# Patient Record
Sex: Female | Born: 1990 | Race: White | Hispanic: No | Marital: Married | Smoking: Current every day smoker
Health system: Southern US, Community
[De-identification: ages and names within clinical notes are randomized; demographics above are authoritative.]

## PROBLEM LIST (undated history)

## (undated) ENCOUNTER — Inpatient Hospital Stay (HOSPITAL_COMMUNITY): Payer: Self-pay

## (undated) DIAGNOSIS — S52509A Unspecified fracture of the lower end of unspecified radius, initial encounter for closed fracture: Secondary | ICD-10-CM

## (undated) DIAGNOSIS — J302 Other seasonal allergic rhinitis: Secondary | ICD-10-CM

## (undated) HISTORY — DX: Other seasonal allergic rhinitis: J30.2

---
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
## 2010-07-12 NOTE — L&D Delivery Note (Signed)
Delivery Note At 12:13 AM a viable female was delivered via Vaginal, Spontaneous Delivery (Presentation: Left Occiput Anterior).  APGAR: 9, 9; weight 7 lb (3175 g).   Placenta status: , .  Cord: 3 vessels with the following complications: .  Cord pH: NA  Anesthesia: Epidural  Episiotomy: None Lacerations: Labial;2nd degree Suture Repair: 3.0 vicryl Est. Blood Loss (mL):   Mom to postpartum.  Baby to nursery-stable.  Alyssa Whitehead J. 05/12/2011, 12:36 AM

## 2010-11-03 LAB — HIV ANTIBODY (ROUTINE TESTING W REFLEX): HIV: NONREACTIVE

## 2010-11-03 LAB — GC/CHLAMYDIA PROBE AMP, GENITAL
Chlamydia: NEGATIVE
Gonorrhea: NEGATIVE

## 2010-11-03 LAB — ANTIBODY SCREEN: Antibody Screen: NEGATIVE

## 2011-03-18 ENCOUNTER — Inpatient Hospital Stay (HOSPITAL_COMMUNITY)
Admission: AD | Admit: 2011-03-18 | Discharge: 2011-03-18 | Disposition: A | Discharge status: Home / Self Care | Payer: Medicaid Other | Source: Ambulatory Visit | Attending: Obstetrics | Admitting: Obstetrics

## 2011-03-18 ENCOUNTER — Encounter (HOSPITAL_COMMUNITY): Payer: Self-pay

## 2011-03-18 DIAGNOSIS — O9989 Other specified diseases and conditions complicating pregnancy, childbirth and the puerperium: Secondary | ICD-10-CM | POA: Insufficient documentation

## 2011-03-18 DIAGNOSIS — R0602 Shortness of breath: Secondary | ICD-10-CM | POA: Insufficient documentation

## 2011-03-18 LAB — URINALYSIS, ROUTINE W REFLEX MICROSCOPIC
Bilirubin Urine: NEGATIVE
Glucose, UA: NEGATIVE mg/dL
Hgb urine dipstick: NEGATIVE
Ketones, ur: NEGATIVE mg/dL
Leukocytes, UA: NEGATIVE
Nitrite: NEGATIVE
Protein, ur: NEGATIVE mg/dL
Specific Gravity, Urine: <1.005 — ABNORMAL LOW (ref 1.005–1.030)
Urobilinogen, UA: 0.2 mg/dL (ref 0.0–1.0)
pH: 6.5 (ref 5.0–8.0)

## 2011-03-18 NOTE — Progress Notes (Signed)
Pt states sob with sitting x28month. Today, happened at work around 0800, denies pain, bleeding or lof, +Fm

## 2011-03-18 NOTE — ED Provider Notes (Signed)
History   Pt presents today c/o SOB for the past month, especially when she sits. She denies CP, fever, abd pain, vag dc, bleeding, or any other problems at this time. She reports GFM and states her last episode of intercourse was 2 days ago.  Chief Complaint  Patient presents with  . Shortness of Breath   HPI  OB History    Grav Para Term Preterm Abortions TAB SAB Ect Mult Living   1               Past Medical History  Diagnosis Date  . No pertinent past medical history     Past Surgical History  Procedure Date  . No past surgeries     No family history on file.  History  Substance Use Topics  . Smoking status: Former Games developer  . Smokeless tobacco: Not on file  . Alcohol Use: No    Allergies: No Known Allergies  Prescriptions prior to admission  Medication Sig Dispense Refill  . prenatal vitamin w/FE, FA (PRENATAL 1 + 1) 27-1 MG TABS Take 1 tablet by mouth at bedtime.          Review of Systems  Constitutional: Negative for fever and malaise/fatigue.  Respiratory: Positive for shortness of breath. Negative for cough, hemoptysis, sputum production and wheezing.   Cardiovascular: Negative for chest pain, palpitations, orthopnea and claudication.  Gastrointestinal: Negative for nausea, vomiting, abdominal pain, diarrhea and constipation.  Genitourinary: Negative for dysuria, urgency, frequency and hematuria.  Neurological: Negative for dizziness and headaches.  Psychiatric/Behavioral: Negative for depression and suicidal ideas.   Physical Exam   Blood pressure 108/75, pulse 90, temperature 97.3 F (36.3 C), temperature source Oral, resp. rate 16, height 5' 2.5" (1.588 m), weight 135 lb 4 oz (61.349 kg), SpO2 97.00%.  Physical Exam  Constitutional: She is oriented to person, place, and time. She appears well-developed and well-nourished. No distress.  HENT:  Head: Normocephalic and atraumatic.  Eyes: EOM are normal. Pupils are equal, round, and reactive to light.   Cardiovascular: Normal rate, regular rhythm and normal heart sounds.  Exam reveals no gallop and no friction rub.   No murmur heard. Respiratory: Effort normal and breath sounds normal. No respiratory distress. She has no wheezes. She has no rales. She exhibits no tenderness.  GI: Soft. She exhibits no distension. There is no tenderness. There is no rebound and no guarding.  Genitourinary: No bleeding around the vagina. No vaginal discharge found.       Cervix is Cl/50/-3.  Neurological: She is alert and oriented to person, place, and time.  Skin: Skin is warm and dry. She is not diaphoretic.  Psychiatric: She has a normal mood and affect. Her behavior is normal. Judgment and thought content normal.    MAU Course  Procedures  Results for orders placed during the hospital encounter of 03/18/11 (from the past 24 hour(s))  URINALYSIS, ROUTINE W REFLEX MICROSCOPIC     Status: Abnormal   Collection Time   03/18/11 10:40 AM      Component Value Range   Color, Urine YELLOW  YELLOW    Appearance CLEAR  CLEAR    Specific Gravity, Urine <1.005 (*) 1.005 - 1.030    pH 6.5  5.0 - 8.0    Glucose, UA NEGATIVE  NEGATIVE (mg/dL)   Hgb urine dipstick NEGATIVE  NEGATIVE    Bilirubin Urine NEGATIVE  NEGATIVE    Ketones, ur NEGATIVE  NEGATIVE (mg/dL)   Protein, ur NEGATIVE  NEGATIVE (mg/dL)   Urobilinogen, UA 0.2  0.0 - 1.0 (mg/dL)   Nitrite NEGATIVE  NEGATIVE    Leukocytes, UA NEGATIVE  NEGATIVE    O2 sat is 97% on room air. Pt is comfortable and breathing easily.  Discussed pt with Dr. Clearance Coots. He advised to have pt keep her scheduled appt.  NST reactive with some uterine irritability. No ctx noted.  Assessment and Plan  SOB: discussed with pt at length. No evidence of PE or other significant medical issue. Pt SOB likely due to restriction of lung expansion from increase fetal size. Discussed diet, activity, risks, and precautions.  Clinton Gallant. Sharona Rovner III, DrHSc, MPAS, PA-C  03/18/2011, 11:30 AM    Henrietta Hoover, PA 03/18/11 1137

## 2011-05-10 ENCOUNTER — Telehealth (HOSPITAL_COMMUNITY): Payer: Self-pay | Admitting: *Deleted

## 2011-05-10 ENCOUNTER — Encounter (HOSPITAL_COMMUNITY): Payer: Self-pay | Admitting: *Deleted

## 2011-05-10 ENCOUNTER — Other Ambulatory Visit: Payer: Self-pay | Admitting: Obstetrics & Gynecology

## 2011-05-10 DIAGNOSIS — O480 Post-term pregnancy: Secondary | ICD-10-CM

## 2011-05-10 NOTE — Telephone Encounter (Signed)
Preadmission screen  

## 2011-05-11 ENCOUNTER — Inpatient Hospital Stay (HOSPITAL_COMMUNITY)
Admission: AD | Admit: 2011-05-11 | Discharge: 2011-05-14 | DRG: 775 | Disposition: A | Discharge status: Home / Self Care | Payer: Medicaid Other | Source: Ambulatory Visit | Attending: Obstetrics & Gynecology | Admitting: Obstetrics & Gynecology

## 2011-05-11 ENCOUNTER — Telehealth (HOSPITAL_COMMUNITY): Payer: Self-pay | Admitting: *Deleted

## 2011-05-11 ENCOUNTER — Other Ambulatory Visit: Payer: Self-pay | Admitting: Obstetrics & Gynecology

## 2011-05-11 ENCOUNTER — Inpatient Hospital Stay (HOSPITAL_COMMUNITY): Payer: Medicaid Other | Admitting: Anesthesiology

## 2011-05-11 ENCOUNTER — Encounter (HOSPITAL_COMMUNITY): Payer: Self-pay | Admitting: Anesthesiology

## 2011-05-11 ENCOUNTER — Inpatient Hospital Stay (HOSPITAL_COMMUNITY): Payer: Medicaid Other

## 2011-05-11 ENCOUNTER — Encounter (HOSPITAL_COMMUNITY): Payer: Self-pay

## 2011-05-11 DIAGNOSIS — IMO0002 Reserved for concepts with insufficient information to code with codable children: Secondary | ICD-10-CM | POA: Diagnosis present

## 2011-05-11 DIAGNOSIS — O4700 False labor before 37 completed weeks of gestation, unspecified trimester: Secondary | ICD-10-CM | POA: Diagnosis present

## 2011-05-11 LAB — CBC
HCT: 35.6 % — ABNORMAL LOW (ref 36.0–46.0)
Hemoglobin: 12 g/dL (ref 12.0–15.0)
MCH: 28 pg (ref 26.0–34.0)
MCHC: 33.7 g/dL (ref 30.0–36.0)
MCV: 83.2 fL (ref 78.0–100.0)
RBC: 4.28 MIL/uL (ref 3.87–5.11)

## 2011-05-11 MED ORDER — ONDANSETRON HCL 4 MG/2ML IJ SOLN: 4.0000 mg | Freq: Four times a day (QID) | INTRAMUSCULAR | Status: DC | PRN

## 2011-05-11 MED ORDER — LACTATED RINGERS IV SOLN: 500.0000 mL | INTRAVENOUS | Status: DC | PRN

## 2011-05-11 MED ORDER — OXYTOCIN 20 UNITS IN LACTATED RINGERS INFUSION - SIMPLE
125.0000 mL/h | Freq: Once | INTRAVENOUS | Status: AC
  Administered 2011-05-12: 125 mL/h via INTRAVENOUS

## 2011-05-11 MED ORDER — ACETAMINOPHEN 325 MG PO TABS: 650.0000 mg | ORAL_TABLET | ORAL | Status: DC | PRN

## 2011-05-11 MED ORDER — LIDOCAINE HCL 1.5 % IJ SOLN
INTRAMUSCULAR | Status: DC | PRN
  Administered 2011-05-11 (×2): 5 mL via EPIDURAL

## 2011-05-11 MED ORDER — LIDOCAINE HCL (PF) 1 % IJ SOLN
30.0000 mL | INTRAMUSCULAR | Status: DC | PRN
  Filled 2011-05-11 (×2): qty 30

## 2011-05-11 MED ORDER — DIPHENHYDRAMINE HCL 50 MG/ML IJ SOLN: 12.5000 mg | INTRAMUSCULAR | Status: DC | PRN

## 2011-05-11 MED ORDER — EPHEDRINE 5 MG/ML INJ
10.0000 mg | INTRAVENOUS | Status: DC | PRN
  Filled 2011-05-11 (×2): qty 4

## 2011-05-11 MED ORDER — PHENYLEPHRINE 40 MCG/ML (10ML) SYRINGE FOR IV PUSH (FOR BLOOD PRESSURE SUPPORT)
80.0000 ug | PREFILLED_SYRINGE | INTRAVENOUS | Status: DC | PRN
  Administered 2011-05-11: 80 ug via INTRAVENOUS
  Filled 2011-05-11 (×2): qty 5

## 2011-05-11 MED ORDER — FENTANYL 2.5 MCG/ML BUPIVACAINE 1/10 % EPIDURAL INFUSION (WH - ANES)
INTRAMUSCULAR | Status: DC | PRN
  Administered 2011-05-11: 14 mL/h via EPIDURAL

## 2011-05-11 MED ORDER — PHENYLEPHRINE 40 MCG/ML (10ML) SYRINGE FOR IV PUSH (FOR BLOOD PRESSURE SUPPORT)
80.0000 ug | PREFILLED_SYRINGE | INTRAVENOUS | Status: DC | PRN
  Filled 2011-05-11: qty 5

## 2011-05-11 MED ORDER — LACTATED RINGERS IV SOLN
INTRAVENOUS | Status: DC
  Administered 2011-05-11 (×3): via INTRAVENOUS

## 2011-05-11 MED ORDER — FLEET ENEMA 7-19 GM/118ML RE ENEM: 1.0000 | ENEMA | RECTAL | Status: DC | PRN

## 2011-05-11 MED ORDER — CITRIC ACID-SODIUM CITRATE 334-500 MG/5ML PO SOLN: 30.0000 mL | ORAL | Status: DC | PRN

## 2011-05-11 MED ORDER — FENTANYL 2.5 MCG/ML BUPIVACAINE 1/10 % EPIDURAL INFUSION (WH - ANES)
14.0000 mL/h | INTRAMUSCULAR | Status: DC
  Administered 2011-05-11 (×2): 14 mL/h via EPIDURAL
  Filled 2011-05-11 (×3): qty 60

## 2011-05-11 MED ORDER — IBUPROFEN 600 MG PO TABS: 600.0000 mg | ORAL_TABLET | Freq: Four times a day (QID) | ORAL | Status: DC | PRN

## 2011-05-11 MED ORDER — EPHEDRINE 5 MG/ML INJ
10.0000 mg | INTRAVENOUS | Status: DC | PRN
  Filled 2011-05-11: qty 4

## 2011-05-11 MED ORDER — LACTATED RINGERS IV SOLN: 500.0000 mL | Freq: Once | INTRAVENOUS | Status: DC

## 2011-05-11 MED ORDER — BUTORPHANOL TARTRATE 2 MG/ML IJ SOLN
1.0000 mg | INTRAMUSCULAR | Status: DC | PRN
  Administered 2011-05-11: 1 mg via INTRAVENOUS
  Filled 2011-05-11 (×2): qty 1

## 2011-05-11 MED ORDER — OXYTOCIN BOLUS FROM INFUSION
500.0000 mL | Freq: Once | INTRAVENOUS | Status: DC
  Filled 2011-05-11: qty 500
  Filled 2011-05-11: qty 1000

## 2011-05-11 MED ORDER — OXYCODONE-ACETAMINOPHEN 5-325 MG PO TABS: 2.0000 | ORAL_TABLET | ORAL | Status: DC | PRN

## 2011-05-11 NOTE — Anesthesia Procedure Notes (Signed)
Epidural Patient location during procedure: OB Start time: 05/11/2011 3:54 PM End time: 05/11/2011 4:00 PM Reason for block: procedure for pain  Staffing Anesthesiologist: Sandrea Hughs Performed by: anesthesiologist   Preanesthetic Checklist Completed: patient identified, site marked, surgical consent, pre-op evaluation, timeout performed, IV checked, risks and benefits discussed and monitors and equipment checked  Epidural Patient position: sitting Prep: site prepped and draped and DuraPrep Patient monitoring: continuous pulse ox and blood pressure Approach: midline Injection technique: LOR air  Needle:  Needle type: Tuohy  Needle gauge: 17 G Needle length: 9 cm Needle insertion depth: 5 cm cm Catheter type: closed end flexible Catheter size: 19 Gauge Catheter at skin depth: 10 cm Test dose: negative and 1.5% lidocaine  Assessment Sensory level: T8 Events: blood not aspirated, injection not painful, no injection resistance, negative IV test and no paresthesia

## 2011-05-11 NOTE — ED Notes (Signed)
Patient blood type verified in [facility] due to discrepancy in prenatal record.

## 2011-05-11 NOTE — Telephone Encounter (Signed)
Preadmission screen  

## 2011-05-11 NOTE — H&P (Signed)
Alyssa Whitehead is a 20 y.o. female presenting for contractions. Maternal Medical History:  Reason for admission: Reason for admission: contractions.  Reason for Admission:   nauseaContractions: Onset was 13-24 hours ago.   Frequency: regular.   Perceived severity is moderate.    Fetal activity: Perceived fetal activity is normal.   Last perceived fetal movement was within the past hour.    Prenatal complications: no prenatal complications   OB History    Grav Para Term Preterm Abortions TAB SAB Ect Mult Living   1              Past Medical History  Diagnosis Date  . No pertinent past medical history    Past Surgical History  Procedure Date  . No past surgeries    Family History: family history is negative for Anesthesia problems, and Hypotension, and Malignant hyperthermia, and Pseudochol deficiency, . Social History:  reports that she quit smoking about 8 months ago. She has never used smokeless tobacco. She reports that she does not drink alcohol or use illicit drugs.  Review of Systems  Constitutional: Negative for fever.  Eyes: Negative for blurred vision.  Respiratory: Negative for shortness of breath.   Gastrointestinal: Negative for nausea and vomiting.  Skin: Negative for rash.  Neurological: Negative for headaches.    Dilation: 3 Effacement (%): 80 Station: -2 Exam by:: Whitehead Blood pressure 133/76, pulse 83, temperature 98.2 F (36.8 C), temperature source Oral, resp. rate 16, height 5\' 3"  (1.6 m), weight 67.189 kg (148 lb 2 oz), SpO2 100.00%. Maternal Exam:  Uterine Assessment: Contraction strength is moderate.  Contraction frequency is irregular.   Abdomen: Patient reports no abdominal tenderness. Fetal presentation: vertex  Introitus: not evaluated.     Fetal Exam Fetal Monitor Review: Variability: moderate (6-25 bpm).   Pattern: accelerations present and no decelerations.    Fetal State Assessment: Category I - tracings are  normal.     Physical Exam  Constitutional: She appears well-developed.  HENT:  Head: Normocephalic.  Neck: Neck supple. No thyromegaly present.  Cardiovascular: Normal rate and regular rhythm.   Respiratory: Breath sounds normal.  GI: Soft. Bowel sounds are normal.  Skin: No rash noted.    Prenatal labs: ABO, Rh: A/Positive/-- (04/24 0000) Antibody: Negative (04/24 0000) Rubella: Immune (04/24 0000) RPR: Nonreactive (04/24 0000)  HBsAg: Negative (04/24 0000)  HIV: Non-reactive, Non-reactive (04/24 0000)  GBS: Negative (10/01 0000)   Assessment/Plan: 20 y.o. nullipara with an IUP @ [redacted]w[redacted]d.  Early labor.  Admit Expectant management Anticipate an NSVD   Whitehead,Alyssa Shisler A 05/11/2011, 4:39 PM

## 2011-05-11 NOTE — Progress Notes (Addendum)
Pt states u/c's since yesterday, was 2cm in office, ctx's nowq5 minutes. +FM. Became regular around 0500 this am. Denies lof, has bloody show.

## 2011-05-11 NOTE — Anesthesia Preprocedure Evaluation (Signed)
Anesthesia Evaluation  Patient identified by MRN, date of birth, ID band Patient awake  General Assessment Comment  Reviewed: Allergy & Precautions, H&P , NPO status , Patient's Chart, lab work & pertinent test results  Airway Mallampati: I TM Distance: >3 FB Neck ROM: full    Dental No notable dental hx.    Pulmonary    Pulmonary exam normal       Cardiovascular     Neuro/Psych Negative Neurological ROS  Negative Psych ROS   GI/Hepatic negative GI ROS Neg liver ROS    Endo/Other  Negative Endocrine ROS  Renal/GU negative Renal ROS  Genitourinary negative   Musculoskeletal negative musculoskeletal ROS (+)   Abdominal Normal abdominal exam  (+)   Peds negative pediatric ROS (+)  Hematology negative hematology ROS (+)   Anesthesia Other Findings   Reproductive/Obstetrics (+) Pregnancy                           Anesthesia Physical Anesthesia Plan  ASA: II  Anesthesia Plan: Epidural   Post-op Pain Management:    Induction:   Airway Management Planned:   Additional Equipment:   Intra-op Plan:   Post-operative Plan:   Informed Consent: I have reviewed the patients History and Physical, chart, labs and discussed the procedure including the risks, benefits and alternatives for the proposed anesthesia with the patient or authorized representative who has indicated his/her understanding and acceptance.     Plan Discussed with:   Anesthesia Plan Comments:         Anesthesia Quick Evaluation  

## 2011-05-11 NOTE — ED Notes (Signed)
Attempted IV X 4, 2x by Sarajane Marek, RNC and X 2 by L. Paschal, RN. L&D ready for patient. Will transport without IV and notify RN.

## 2011-05-12 ENCOUNTER — Encounter (HOSPITAL_COMMUNITY): Payer: Self-pay | Admitting: *Deleted

## 2011-05-12 LAB — CBC
HCT: 32.9 % — ABNORMAL LOW (ref 36.0–46.0)
Hemoglobin: 10.9 g/dL — ABNORMAL LOW (ref 12.0–15.0)
MCH: 27.7 pg (ref 26.0–34.0)
MCHC: 33.1 g/dL (ref 30.0–36.0)
MCV: 83.5 fL (ref 78.0–100.0)
RDW: 14.1 % (ref 11.5–15.5)

## 2011-05-12 MED ORDER — METHYLERGONOVINE MALEATE 0.2 MG PO TABS: 0.2000 mg | ORAL_TABLET | ORAL | Status: DC | PRN

## 2011-05-12 MED ORDER — LANOLIN HYDROUS EX OINT: TOPICAL_OINTMENT | CUTANEOUS | Status: DC | PRN

## 2011-05-12 MED ORDER — PRENATAL PLUS 27-1 MG PO TABS
1.0000 | ORAL_TABLET | Freq: Every day | ORAL | Status: DC
  Administered 2011-05-12: 1 via ORAL
  Filled 2011-05-12: qty 1

## 2011-05-12 MED ORDER — DIPHENHYDRAMINE HCL 25 MG PO CAPS: 25.0000 mg | ORAL_CAPSULE | Freq: Four times a day (QID) | ORAL | Status: DC | PRN

## 2011-05-12 MED ORDER — OXYCODONE-ACETAMINOPHEN 5-325 MG PO TABS: 1.0000 | ORAL_TABLET | ORAL | Status: DC | PRN

## 2011-05-12 MED ORDER — BENZOCAINE-MENTHOL 20-0.5 % EX AERO
INHALATION_SPRAY | CUTANEOUS | Status: AC
  Filled 2011-05-12: qty 56

## 2011-05-12 MED ORDER — ONDANSETRON HCL 4 MG PO TABS: 4.0000 mg | ORAL_TABLET | ORAL | Status: DC | PRN

## 2011-05-12 MED ORDER — ZOLPIDEM TARTRATE 5 MG PO TABS: 5.0000 mg | ORAL_TABLET | Freq: Every evening | ORAL | Status: DC | PRN

## 2011-05-12 MED ORDER — TETANUS-DIPHTH-ACELL PERTUSSIS 5-2.5-18.5 LF-MCG/0.5 IM SUSP
0.5000 mL | Freq: Once | INTRAMUSCULAR | Status: DC
  Filled 2011-05-12: qty 0.5

## 2011-05-12 MED ORDER — SENNOSIDES-DOCUSATE SODIUM 8.6-50 MG PO TABS
2.0000 | ORAL_TABLET | Freq: Every day | ORAL | Status: DC
  Administered 2011-05-12: 2 via ORAL

## 2011-05-12 MED ORDER — MEDROXYPROGESTERONE ACETATE 150 MG/ML IM SUSP: 150.0000 mg | INTRAMUSCULAR | Status: DC | PRN

## 2011-05-12 MED ORDER — SIMETHICONE 80 MG PO CHEW: 80.0000 mg | CHEWABLE_TABLET | ORAL | Status: DC | PRN

## 2011-05-12 MED ORDER — IBUPROFEN 100 MG/5ML PO SUSP
600.0000 mg | Freq: Four times a day (QID) | ORAL | Status: DC
  Administered 2011-05-12 – 2011-05-14 (×10): 600 mg via ORAL
  Filled 2011-05-12 (×10): qty 30

## 2011-05-12 MED ORDER — IBUPROFEN 600 MG PO TABS
600.0000 mg | ORAL_TABLET | Freq: Four times a day (QID) | ORAL | Status: DC
  Filled 2011-05-12: qty 1

## 2011-05-12 MED ORDER — BENZOCAINE-MENTHOL 20-0.5 % EX AERO
1.0000 "application " | INHALATION_SPRAY | CUTANEOUS | Status: DC | PRN
  Administered 2011-05-12 – 2011-05-14 (×2): 1 via TOPICAL

## 2011-05-12 MED ORDER — METHYLERGONOVINE MALEATE 0.2 MG/ML IJ SOLN: 0.2000 mg | INTRAMUSCULAR | Status: DC | PRN

## 2011-05-12 MED ORDER — DIBUCAINE 1 % RE OINT: 1.0000 | TOPICAL_OINTMENT | RECTAL | Status: DC | PRN

## 2011-05-12 MED ORDER — ONDANSETRON HCL 4 MG/2ML IJ SOLN: 4.0000 mg | INTRAMUSCULAR | Status: DC | PRN

## 2011-05-12 MED ORDER — WITCH HAZEL-GLYCERIN EX PADS: 1.0000 "application " | MEDICATED_PAD | CUTANEOUS | Status: DC | PRN

## 2011-05-12 NOTE — Anesthesia Postprocedure Evaluation (Signed)
Anesthesia Post Note  Patient: Alyssa Whitehead  Procedure(s) Performed: * No procedures listed *  Anesthesia type: Epidural  Patient location: Mother/Baby  Post pain: Pain level controlled  Post assessment: Post-op Vital signs reviewed  Last Vitals:  Filed Vitals:   05/12/11 0406  BP: 101/67  Pulse: 89  Temp: 36.7 C  Resp: 18    Post vital signs: Reviewed  Level of consciousness: awake  Complications: No apparent anesthesia complications

## 2011-05-12 NOTE — Anesthesia Postprocedure Evaluation (Signed)
  Anesthesia Post-op Note  Patient: Alyssa Whitehead  Procedure(s) Performed: * No procedures listed *  Patient Location: PACU and Mother/Baby  Anesthesia Type: Epidural  Level of Consciousness: awake, alert  and oriented  Airway and Oxygen Therapy: Patient Spontanous Breathing  Post-op Pain: mild  Post-op Assessment: Post-op Vital signs reviewed  Post-op Vital Signs: Reviewed  Complications: No apparent anesthesia complications

## 2011-05-12 NOTE — Progress Notes (Signed)
Patient C/O of pain and pressure and wanting to urinate, went to the bathroom and done peri care still no result than I in I/O her for 900cc. Pt expressed instant decrease in pain and pressure.

## 2011-05-12 NOTE — Addendum Note (Signed)
Addendum  created 05/12/11 1332 by Suella Grove   Modules edited:Charges VN, Notes Section

## 2011-05-12 NOTE — Progress Notes (Signed)
UR chart review completed.  

## 2011-05-12 NOTE — Progress Notes (Signed)
  Post Partum Day 1 S/P spontaneous vaginal RH status/Rubella reviewed.  Feeding: breast Subjective: No HA, SOB, CP, F/C, breast symptoms. Normal vaginal bleeding, no clots.     Objective: BP 101/67  Pulse 89  Temp(Src) 98.1 F (36.7 C) (Oral)  Resp 18  Ht 5\' 3"  (1.6 m)  Wt 67.189 kg (148 lb 2 oz)  BMI 26.24 kg/m2  SpO2 99%  Breastfeeding? Unknown   Physical Exam:  General: alert Lochia: appropriate Uterine Fundus: firm DVT Evaluation: No evidence of DVT seen on physical exam. Ext: No c/c/e  Basename 05/12/11 0545 05/11/11 1230  HGB 10.9* 12.0  HCT 32.9* 35.6*      Assessment/Plan: 20 y.o.  PPD #1 .  normal postpartum exam Continue current postpartum care  Ambulate   LOS: 1 day   JACKSON-MOORE,Tyesha Joffe A 05/12/2011, 11:05 AM

## 2011-05-13 ENCOUNTER — Ambulatory Visit (HOSPITAL_COMMUNITY): Payer: Medicaid Other

## 2011-05-14 MED ORDER — BENZOCAINE-MENTHOL 20-0.5 % EX AERO
INHALATION_SPRAY | CUTANEOUS | Status: AC
  Filled 2011-05-14: qty 56

## 2011-05-14 MED ORDER — NORETHINDRONE 0.35 MG PO TABS: 1.0000 | ORAL_TABLET | Freq: Every day | ORAL | Status: DC

## 2011-05-14 MED ORDER — IBUPROFEN 100 MG/5ML PO SUSP: 600.0000 mg | Freq: Four times a day (QID) | ORAL | Status: DC

## 2011-05-14 NOTE — Discharge Summary (Signed)
  Obstetric Discharge Summary Reason for Admission: onset of labor Prenatal Procedures: none Intrapartum Procedures: spontaneous vaginal delivery Postpartum Procedures: none Complications-Operative and Postpartum: none  Hemoglobin  Date Value Range Status  05/12/2011 10.9* 12.0-15.0 (g/dL) Final     HCT  Date Value Range Status  05/12/2011 32.9* 36.0-46.0 (%) Final    Discharge Diagnoses: Term Pregnancy-delivered  Discharge Information: Date: 05/14/2011 Activity: pelvic rest Diet: routine Medications:  Prior to Admission medications   Medication Sig Start Date End Date Taking? Authorizing Provider  calcium carbonate (TUMS - DOSED IN MG ELEMENTAL CALCIUM) 500 MG chewable tablet Chew 2 tablets by mouth daily as needed. Heartburn     Yes Historical Provider, MD  ibuprofen (ADVIL,MOTRIN) 100 MG/5ML suspension Take 30 mLs (600 mg total) by mouth every 6 (six) hours. 05/14/11   Roseanna Rainbow, MD  norethindrone (ORTHO MICRONOR) 0.35 MG tablet Take 1 tablet (0.35 mg total) by mouth daily. 05/14/11 05/13/12  Roseanna Rainbow, MD  prenatal vitamin w/FE, FA (PRENATAL 1 + 1) 27-1 MG TABS Take 1 tablet by mouth at bedtime.     Historical Provider, MD   Condition: stable Instructions: refer to routine discharge instructions Discharge to: home Follow-up Information    Follow up with JACKSON-MOORE,Kayveon Lennartz A, MD. Call in 6 weeks.   Contact information:   [address] [place] [address] [phone]          Newborn Data: Live born  Information for the patient's newborn:  Jeananne, [place] [[id number]]  female  Home with mother.  JACKSON-MOORE,Brinlynn Gorton A 05/14/2011, 9:46 AM

## 2011-05-14 NOTE — Progress Notes (Signed)
Post Partum Day #2 S/P:spontaneous vaginal  RH status/Rubella reviewed.  Feeding: breast Subjective: No HA, SOB, CP, F/C, breast symptoms: No. Normal vaginal bleeding, no clots.     Objective:  Blood pressure 96/55, pulse 63, temperature 97.9 F (36.6 C), temperature source Oral, resp. rate 18, height 5\' 3"  (1.6 m), weight 67.189 kg (148 lb 2 oz), SpO2 99.00%, unknown if currently breastfeeding.   Physical Exam:  General: alert Lochia: appropriate Uterine Fundus: firm DVT Evaluation: No evidence of DVT seen on physical exam. Ext: No c/c/e  Basename 05/12/11 0545 05/11/11 1230  HGB 10.9* 12.0  HCT 32.9* 35.6*    Assessment/Plan: 20 y.o.  PPD # 2 .  normal postpartum exam patient is a candidate for oral progesterone-only contraceptive for contraception, with no contraindications Continue current postpartum care D/C home   LOS: 3 days   JACKSON-MOORE,Curtiss Mahmood A 05/14/2011, 9:40 AM

## 2011-05-15 ENCOUNTER — Inpatient Hospital Stay (HOSPITAL_COMMUNITY): Admission: RE | Admit: 2011-05-15 | Payer: Medicaid Other | Source: Ambulatory Visit

## 2011-07-13 HISTORY — PX: WRIST FRACTURE SURGERY: SHX121

## 2011-09-18 ENCOUNTER — Emergency Department (HOSPITAL_BASED_OUTPATIENT_CLINIC_OR_DEPARTMENT_OTHER)
Admission: EM | Admit: 2011-09-18 | Discharge: 2011-09-18 | Disposition: A | Discharge status: Home / Self Care | Payer: Medicaid Other | Attending: Emergency Medicine | Admitting: Emergency Medicine

## 2011-09-18 ENCOUNTER — Emergency Department (INDEPENDENT_AMBULATORY_CARE_PROVIDER_SITE_OTHER): Payer: Medicaid Other

## 2011-09-18 ENCOUNTER — Emergency Department (HOSPITAL_BASED_OUTPATIENT_CLINIC_OR_DEPARTMENT_OTHER): Payer: Medicaid Other

## 2011-09-18 ENCOUNTER — Encounter (HOSPITAL_BASED_OUTPATIENT_CLINIC_OR_DEPARTMENT_OTHER): Payer: Self-pay

## 2011-09-18 DIAGNOSIS — IMO0001 Reserved for inherently not codable concepts without codable children: Secondary | ICD-10-CM | POA: Insufficient documentation

## 2011-09-18 DIAGNOSIS — S52123A Displaced fracture of head of unspecified radius, initial encounter for closed fracture: Secondary | ICD-10-CM | POA: Insufficient documentation

## 2011-09-18 DIAGNOSIS — W19XXXA Unspecified fall, initial encounter: Secondary | ICD-10-CM

## 2011-09-18 DIAGNOSIS — M25539 Pain in unspecified wrist: Secondary | ICD-10-CM | POA: Insufficient documentation

## 2011-09-18 DIAGNOSIS — S52509A Unspecified fracture of the lower end of unspecified radius, initial encounter for closed fracture: Secondary | ICD-10-CM

## 2011-09-18 DIAGNOSIS — F17200 Nicotine dependence, unspecified, uncomplicated: Secondary | ICD-10-CM | POA: Insufficient documentation

## 2011-09-18 DIAGNOSIS — M25559 Pain in unspecified hip: Secondary | ICD-10-CM

## 2011-09-18 DIAGNOSIS — M549 Dorsalgia, unspecified: Secondary | ICD-10-CM

## 2011-09-18 DIAGNOSIS — R269 Unspecified abnormalities of gait and mobility: Secondary | ICD-10-CM | POA: Insufficient documentation

## 2011-09-18 DIAGNOSIS — S52539A Colles' fracture of unspecified radius, initial encounter for closed fracture: Secondary | ICD-10-CM

## 2011-09-18 DIAGNOSIS — S52599A Other fractures of lower end of unspecified radius, initial encounter for closed fracture: Secondary | ICD-10-CM | POA: Insufficient documentation

## 2011-09-18 DIAGNOSIS — S7000XA Contusion of unspecified hip, initial encounter: Secondary | ICD-10-CM | POA: Insufficient documentation

## 2011-09-18 DIAGNOSIS — S7001XA Contusion of right hip, initial encounter: Secondary | ICD-10-CM

## 2011-09-18 MED ORDER — DIAZEPAM 5 MG PO TABS: 5.0000 mg | ORAL_TABLET | Freq: Two times a day (BID) | ORAL | Status: AC

## 2011-09-18 MED ORDER — HYDROMORPHONE HCL PF 1 MG/ML IJ SOLN
INTRAMUSCULAR | Status: AC
  Administered 2011-09-18: 1 mg via INTRAMUSCULAR
  Filled 2011-09-18: qty 1

## 2011-09-18 MED ORDER — FENTANYL CITRATE 0.05 MG/ML IJ SOLN: 50.0000 ug | Freq: Once | INTRAMUSCULAR | Status: DC

## 2011-09-18 MED ORDER — MORPHINE SULFATE 4 MG/ML IJ SOLN
INTRAMUSCULAR | Status: AC
  Filled 2011-09-18: qty 1

## 2011-09-18 MED ORDER — OXYCODONE-ACETAMINOPHEN 5-325 MG PO TABS: 1.0000 | ORAL_TABLET | ORAL | Status: AC | PRN

## 2011-09-18 MED ORDER — HYDROMORPHONE HCL PF 1 MG/ML IJ SOLN
1.0000 mg | Freq: Once | INTRAMUSCULAR | Status: AC
  Administered 2011-09-18: 1 mg via INTRAMUSCULAR
  Filled 2011-09-18: qty 1

## 2011-09-18 MED ORDER — HYDROMORPHONE HCL PF 1 MG/ML IJ SOLN
1.0000 mg | Freq: Once | INTRAMUSCULAR | Status: AC
  Administered 2011-09-18: 1 mg via INTRAMUSCULAR

## 2011-09-18 MED ORDER — FENTANYL CITRATE 0.05 MG/ML IJ SOLN
INTRAMUSCULAR | Status: AC
  Filled 2011-09-18: qty 2

## 2011-09-18 NOTE — Discharge Instructions (Signed)
Contusion A contusion is a deep bruise. Contusions are the result of an injury that caused bleeding under the skin. The contusion may turn blue, purple, or yellow. Minor injuries will give you a painless contusion, but more severe contusions may stay painful and swollen for a few weeks.  CAUSES  A contusion is usually caused by a blow, trauma, or direct force to an area of the body. SYMPTOMS   Swelling and redness of the injured area.   Bruising of the injured area.   Tenderness and soreness of the injured area.   Pain.  DIAGNOSIS  The diagnosis can be made by taking a history and physical exam. An X-ray, CT scan, or MRI may be needed to determine if there were any associated injuries, such as fractures. TREATMENT  Specific treatment will depend on what area of the body was injured. In general, the best treatment for a contusion is resting, icing, elevating, and applying cold compresses to the injured area. Over-the-counter medicines may also be recommended for pain control. Ask your caregiver what the best treatment is for your contusion. HOME CARE INSTRUCTIONS   Put ice on the injured area.   Put ice in a plastic bag.   Place a towel between your skin and the bag.   Leave the ice on for 15 to 20 minutes, 3 to 4 times a day.   Only take over-the-counter or prescription medicines for pain, discomfort, or fever as directed by your caregiver. Your caregiver may recommend avoiding anti-inflammatory medicines (aspirin, ibuprofen, and naproxen) for 48 hours because these medicines may increase bruising.   Rest the injured area.   If possible, elevate the injured area to reduce swelling.  SEEK IMMEDIATE MEDICAL CARE IF:   You have increased bruising or swelling.   You have pain that is getting worse.   Your swelling or pain is not relieved with medicines.  MAKE SURE YOU:   Understand these instructions.   Will watch your condition.   Will get help right away if you are not  doing well or get worse.  Document Released: 04/07/2005 Document Revised: 06/17/2011 Document Reviewed: 05/03/2011 [facility] Patient Information 2012 [place], [place].  Radial Fracture You have a broken bone (fracture) of the forearm. This is the part of your arm between the elbow and your wrist. Your forearm is made up of two bones. These are the radius and ulna. Your fracture is in the radial shaft. This is the bone in your forearm located on the thumb side. A cast or splint is used to protect and keep your injured bone from moving. The cast or splint will be on generally for about 5 to 6 weeks, with individual variations. HOME CARE INSTRUCTIONS   Keep the injured part elevated while sitting or lying down. Keep the injury above the level of your heart (the center of the chest). This will decrease swelling and pain.   Apply ice to the injury for 15 to 20 minutes, 3 to 4 times per day while awake, for 2 days. Put the ice in a plastic bag and place a towel between the bag of ice and your cast or splint.   Move your fingers to avoid stiffness and minimize swelling.   If you have a plaster or fiberglass cast:   Do not try to scratch the skin under the cast using sharp or pointed objects.   Check the skin around the cast every day. You may put lotion on any red or sore areas.  Keep your cast dry and clean.   If you have a plaster splint:   Wear the splint as directed.   You may loosen the elastic around the splint if your fingers become numb, tingle, or turn cold or blue.   Do not put pressure on any part of your cast or splint. It may break. Rest your cast only on a pillow for the first 24 hours until it is fully hardened.   Your cast or splint can be protected during bathing with a plastic bag. Do not lower the cast or splint into water.   Only take over-the-counter or prescription medicines for pain, discomfort, or fever as directed by your caregiver.  SEEK IMMEDIATE MEDICAL CARE IF:     Your cast gets damaged or breaks.   You have more severe pain or swelling than you did before getting the cast.   You have severe pain when stretching your fingers.   There is a bad smell, new stains and/or pus-like (purulent) drainage coming from under the cast.   Your fingers or hand turn pale or blue and become cold or your loose feeling.  Document Released: 12/09/2005 Document Revised: 06/17/2011 Document Reviewed: 03/07/2006 [facility] Patient Information 2012 [place], [place].

## 2011-09-18 NOTE — ED Provider Notes (Signed)
This chart was scribed for Alyssa Christen, MD by Alyssa Whitehead. The patient was seen in room MH09/MH09 and the patient's care was started at 6:29 PM.   CSN: [id number]  Arrival date & time 09/18/11  1800   First MD Initiated Contact with Patient 09/18/11 1824      Chief Complaint  Patient presents with  . Fall    (Consider location/radiation/quality/duration/timing/severity/associated sxs/prior treatment) Patient is a 21 y.o. female presenting with fall. The history is provided by the patient.  Fall The accident occurred 1 to 2 hours ago. She landed on dirt. There was no blood loss. The point of impact was the right wrist. The pain is present in the right wrist and left shoulder. The pain is severe. She was ambulatory at the scene. There was no entrapment after the fall. Pertinent negatives include no fever, no abdominal pain, no nausea, no vomiting and no headaches. The symptoms are aggravated by extension, flexion, ambulation, rotation and use of the injured limb. She has tried nothing for the symptoms.   Alyssa Whitehead is a 21 y.o. female who presents to the Emergency Department complaining of of a fall from a horse today.  Pt states that she was thrown from a horse and landed on her buttocks and right wrist.  Pt c/o severe pain to left elbow, right wrist, buttocks. Pt w/ difficulty ambulating, difficulty weight bearing.  Pt denies abdominal pain, nausea, chest pain, neck pain.  There are no other associated symptoms and no other alleviating or aggravating factors.     Pt was not wearing a helmet.      Past Medical History  Diagnosis Date  . No pertinent past medical history     Past Surgical History  Procedure Date  . No past surgeries     Family History  Problem Relation Age of Onset  . Anesthesia problems Neg Hx   . Hypotension Neg Hx   . Malignant hyperthermia Neg Hx   . Pseudochol deficiency Neg Hx     History  Substance Use Topics  . Smoking status: Current  Everyday Smoker -- 0.5 packs/day    Types: Cigarettes    Last Attempt to Quit: 09/09/2010  . Smokeless tobacco: Never Used  . Alcohol Use: Yes     drank some today    OB History    Grav Para Term Preterm Abortions TAB SAB Ect Mult Living   1 1 1       1       Review of Systems  Constitutional: Negative for fever and chills.  HENT: Negative for rhinorrhea.   Eyes: Negative for pain.  Respiratory: Negative for cough and shortness of breath.   Cardiovascular: Negative for chest pain.  Gastrointestinal: Negative for nausea, vomiting, abdominal pain and diarrhea.  Genitourinary: Negative for dysuria.  Musculoskeletal: Positive for gait problem. Negative for back pain.  Skin: Negative for color change and rash.  Neurological: Negative for weakness and headaches.  All other systems reviewed and are negative.    Allergies  Review of patient's allergies indicates no known allergies.  Home Medications   Current Outpatient Rx  Name Route Sig Dispense Refill  . SEASONIQUE PO Oral Take by mouth.    Marland Kitchen CALCIUM CARBONATE ANTACID 500 MG PO CHEW Oral Chew 2 tablets by mouth daily as needed. Heartburn      . IBUPROFEN 100 MG/5ML PO SUSP Oral Take 30 mLs (600 mg total) by mouth every 6 (six) hours. 473 mL 1  .  NORETHINDRONE 0.35 MG PO TABS Oral Take 1 tablet (0.35 mg total) by mouth daily. 28 tablet 11  . PRENATAL PLUS 27-1 MG PO TABS Oral Take 1 tablet by mouth at bedtime.       BP 94/56  Pulse 77  Temp(Src) 98.5 F (36.9 C) (Oral)  Resp 18  Ht 5\' 3"  (1.6 m)  Wt 130 lb (58.968 kg)  BMI 23.03 kg/m2  SpO2 95%  LMP 09/04/2011  Breastfeeding? No  Physical Exam  Nursing note and vitals reviewed. Constitutional: She is oriented to person, place, and time. She appears well-developed and well-nourished. No distress.  HENT:  Head: Normocephalic and atraumatic.  Eyes: EOM are normal. Pupils are equal, round, and reactive to light.  Neck: Normal range of motion. Neck supple. No  tracheal deviation present.  Cardiovascular: Normal rate, regular rhythm and normal heart sounds.  Exam reveals no friction rub.   No murmur heard. Pulmonary/Chest: Effort normal and breath sounds normal. No respiratory distress.  Abdominal: Soft. She exhibits no distension.  Musculoskeletal: She exhibits tenderness.       Right side: No tenderness over shoulder or elbow, right wrist deformity, palpable radial pulse, senation in tact in right hand, cap refill less than 2 seconds, DP pulses in tact  Left side: palpable radial pulse, cap refills less than 2 seconds, medial tenderness to wrist  Mild tenderness over right hip, no tenderness over left hip, legs of equal length  Neurological: She is alert and oriented to person, place, and time. No sensory deficit.  Skin: Skin is warm and dry.  Psychiatric: She has a normal mood and affect. Her behavior is normal.    ED Course  Procedures (including critical care time) DIAGNOSTIC STUDIES: Oxygen Saturation is 95% on room air, adequate by my interpretation.    COORDINATION OF CARE:    Labs Reviewed - No data to display Dg Lumbar Spine Complete  09/18/2011  *RADIOLOGY REPORT*  Clinical Data: Fall.  Back and right hip pain.  LUMBAR SPINE - COMPLETE 4+ VIEW  Comparison: None.  Findings: Five lumbar-type vertebral bodies show normal alignment. No evidence of fracture.  No significant degenerative change.  No focal lesion.  IMPRESSION: Negative radiographs  Original Report Authenticated By: Thomasenia Sales, M.D.   Dg Elbow Complete Left  09/18/2011  *RADIOLOGY REPORT*  Clinical Data: Fall.  Pain.  LEFT ELBOW - COMPLETE 3+ VIEW  Comparison: None.  Findings: Positioning is suboptimal.  I am suspicious of a minor radial head fracture.  Difficult to be certain given the positioning difficulties.  No evidence of fracture of the humerus or ulna.  IMPRESSION: Suspicion of small radial head fracture.  Suboptimal positioning makes certainty difficult.   Original Report Authenticated By: Thomasenia Sales, M.D.   Dg Wrist Complete Right  09/18/2011  *RADIOLOGY REPORT*  Clinical Data: Right wrist pain following a fall.  RIGHT WRIST - COMPLETE 3+ VIEW  Comparison: None.  Findings: Comminuted, impacted fracture of the distal radial metaphysis and epiphysis with one third shaft width of dorsal displacement of the distal fragment as well as dorsal angulation of the distal fragment.  The distal ulna appears intact without a normally developed ulnar styloid.  IMPRESSION: Distal radius fracture, as described above.  Original Report Authenticated By: Darrol Angel, M.D.   Dg Hip Complete Right  09/18/2011  *RADIOLOGY REPORT*  Clinical Data: Right hip and buttock pain following a fall.  RIGHT HIP - COMPLETE 2+ VIEW  Comparison: None.  Findings: Normal appearing  bones and soft tissues without fracture or dislocation.  IMPRESSION: Normal examination.  Original Report Authenticated By: Darrol Angel, M.D.     No diagnosis found.    MDM  Patient with a right distal radius fracture and a left radial head fracture on her x-rays.  She will have a sugar tongue splint placed on the right arm.  She will have a posterior splint placed on the left arm.  She was given slings for both.  The family wants her to followup with Dr. Otelia Santee as an outpatient.  If she needs a hand specialist they would like to specific to see Dr. Amanda Pea and I will give her  information for both of these physicians.  If patient is able to ambulate appropriately I believe her hip pain is likely contusion from her fall as those x-rays are normal.  I believe she will be able to be safely discharged home she is a number of family members are who be able to assist her at home and she understands importance of followup on Monday for her fractures.     I personally performed the services described in this documentation, which was scribed in my presence. The recorded information has been reviewed  and considered.   Alyssa Christen, MD 09/18/11 2110

## 2011-09-18 NOTE — ED Notes (Signed)
Pt states that pain has returned after being moved for radiology studies

## 2011-09-18 NOTE — ED Notes (Signed)
I applied a sugar tong splint to patient's right arm using fiberglass materials, kerlix and ace. I then fit and applied arm sling to same arm, and a second sling on patient's left arm for elbow pain. Patient voiced pain in left wrist and the mother felt her left wrist looked displaced. After finishing splint and both slings, patient is no longer having pain in left wrist.

## 2011-09-18 NOTE — ED Notes (Signed)
Pt states that she was thrown from a horse, landed on her buttocks and R wrist.  Pt with severe pain to R wrist and buttocks.  Pt with difficulty ambulating, shuffling gait, difficulty weight bearing.

## 2011-09-21 ENCOUNTER — Other Ambulatory Visit: Payer: Self-pay | Admitting: Orthopedic Surgery

## 2011-09-21 ENCOUNTER — Encounter (HOSPITAL_BASED_OUTPATIENT_CLINIC_OR_DEPARTMENT_OTHER): Payer: Self-pay | Admitting: *Deleted

## 2011-09-22 ENCOUNTER — Ambulatory Visit (HOSPITAL_BASED_OUTPATIENT_CLINIC_OR_DEPARTMENT_OTHER): Payer: Medicaid Other | Admitting: Anesthesiology

## 2011-09-22 ENCOUNTER — Encounter (HOSPITAL_BASED_OUTPATIENT_CLINIC_OR_DEPARTMENT_OTHER): Payer: Self-pay | Admitting: Anesthesiology

## 2011-09-22 ENCOUNTER — Ambulatory Visit (HOSPITAL_BASED_OUTPATIENT_CLINIC_OR_DEPARTMENT_OTHER)
Admission: RE | Admit: 2011-09-22 | Discharge: 2011-09-22 | Disposition: A | Discharge status: Home / Self Care | Payer: Medicaid Other | Source: Ambulatory Visit | Attending: Orthopedic Surgery | Admitting: Orthopedic Surgery

## 2011-09-22 ENCOUNTER — Encounter (HOSPITAL_BASED_OUTPATIENT_CLINIC_OR_DEPARTMENT_OTHER)
Admission: RE | Disposition: A | Discharge status: Home / Self Care | Payer: Self-pay | Source: Ambulatory Visit | Attending: Orthopedic Surgery

## 2011-09-22 DIAGNOSIS — K219 Gastro-esophageal reflux disease without esophagitis: Secondary | ICD-10-CM | POA: Insufficient documentation

## 2011-09-22 DIAGNOSIS — F17200 Nicotine dependence, unspecified, uncomplicated: Secondary | ICD-10-CM | POA: Insufficient documentation

## 2011-09-22 DIAGNOSIS — W1789XA Other fall from one level to another, initial encounter: Secondary | ICD-10-CM | POA: Insufficient documentation

## 2011-09-22 DIAGNOSIS — S52509A Unspecified fracture of the lower end of unspecified radius, initial encounter for closed fracture: Secondary | ICD-10-CM

## 2011-09-22 DIAGNOSIS — Y9352 Activity, horseback riding: Secondary | ICD-10-CM | POA: Insufficient documentation

## 2011-09-22 DIAGNOSIS — Y998 Other external cause status: Secondary | ICD-10-CM | POA: Insufficient documentation

## 2011-09-22 DIAGNOSIS — G5600 Carpal tunnel syndrome, unspecified upper limb: Secondary | ICD-10-CM | POA: Insufficient documentation

## 2011-09-22 DIAGNOSIS — S52599A Other fractures of lower end of unspecified radius, initial encounter for closed fracture: Secondary | ICD-10-CM | POA: Insufficient documentation

## 2011-09-22 HISTORY — DX: Unspecified fracture of the lower end of unspecified radius, initial encounter for closed fracture: S52.509A

## 2011-09-22 LAB — POCT HEMOGLOBIN-HEMACUE: Hemoglobin: 13.4 g/dL (ref 12.0–15.0)

## 2011-09-22 SURGERY — OPEN REDUCTION INTERNAL FIXATION (ORIF) DISTAL RADIUS FRACTURE
Anesthesia: General | Site: Wrist | Laterality: Right | Wound class: Clean

## 2011-09-22 MED ORDER — HYDROMORPHONE HCL PF 1 MG/ML IJ SOLN: 0.2500 mg | INTRAMUSCULAR | Status: DC | PRN

## 2011-09-22 MED ORDER — OXYCODONE-ACETAMINOPHEN 5-325 MG PO TABS: 1.0000 | ORAL_TABLET | ORAL | Status: AC | PRN

## 2011-09-22 MED ORDER — ONDANSETRON HCL 4 MG/2ML IJ SOLN
INTRAMUSCULAR | Status: DC | PRN
  Administered 2011-09-22: 4 mg via INTRAVENOUS

## 2011-09-22 MED ORDER — CHLORHEXIDINE GLUCONATE 4 % EX LIQD: 60.0000 mL | Freq: Once | CUTANEOUS | Status: DC

## 2011-09-22 MED ORDER — DEXAMETHASONE SODIUM PHOSPHATE 4 MG/ML IJ SOLN
INTRAMUSCULAR | Status: DC | PRN
  Administered 2011-09-22: 10 mg via INTRAVENOUS

## 2011-09-22 MED ORDER — CEFAZOLIN SODIUM 1-5 GM-% IV SOLN
1.0000 g | INTRAVENOUS | Status: AC
  Administered 2011-09-22: 1 g via INTRAVENOUS

## 2011-09-22 MED ORDER — LIDOCAINE-EPINEPHRINE 1.5-1:200000 % IJ SOLN
INTRAMUSCULAR | Status: DC | PRN
  Administered 2011-09-22: 20 mL via INTRADERMAL

## 2011-09-22 MED ORDER — PROPOFOL 10 MG/ML IV EMUL
INTRAVENOUS | Status: DC | PRN
  Administered 2011-09-22: 300 mg via INTRAVENOUS

## 2011-09-22 MED ORDER — FENTANYL CITRATE 0.05 MG/ML IJ SOLN
50.0000 ug | INTRAMUSCULAR | Status: DC | PRN
  Administered 2011-09-22: 50 ug via INTRAVENOUS

## 2011-09-22 MED ORDER — BUPIVACAINE-EPINEPHRINE PF 0.5-1:200000 % IJ SOLN
INTRAMUSCULAR | Status: DC | PRN
  Administered 2011-09-22: 30 mL

## 2011-09-22 MED ORDER — LIDOCAINE HCL (CARDIAC) 20 MG/ML IV SOLN
INTRAVENOUS | Status: DC | PRN
  Administered 2011-09-22: 60 mg via INTRAVENOUS

## 2011-09-22 MED ORDER — LACTATED RINGERS IV SOLN
INTRAVENOUS | Status: DC
  Administered 2011-09-22 (×2): via INTRAVENOUS

## 2011-09-22 MED ORDER — MIDAZOLAM HCL 2 MG/2ML IJ SOLN
1.0000 mg | INTRAMUSCULAR | Status: DC | PRN
  Administered 2011-09-22 (×2): 1 mg via INTRAVENOUS

## 2011-09-22 SURGICAL SUPPLY — 70 items
APL SKNCLS STERI-STRIP NONHPOA (GAUZE/BANDAGES/DRESSINGS)
BAG DECANTER FOR FLEXI CONT (MISCELLANEOUS) IMPLANT
BANDAGE ELASTIC 3 VELCRO ST LF (GAUZE/BANDAGES/DRESSINGS) ×3 IMPLANT
BANDAGE ELASTIC 4 VELCRO ST LF (GAUZE/BANDAGES/DRESSINGS) ×3 IMPLANT
BANDAGE GAUZE ELAST BULKY 4 IN (GAUZE/BANDAGES/DRESSINGS) ×3 IMPLANT
BENZOIN TINCTURE PRP APPL 2/3 (GAUZE/BANDAGES/DRESSINGS) IMPLANT
BIT DRILL 2 FAST STEP (BIT) ×3 IMPLANT
BIT DRILL 2.5X4 QC (BIT) ×3 IMPLANT
BLADE MINI RND TIP GREEN BEAV (BLADE) IMPLANT
BLADE SURG 15 STRL LF DISP TIS (BLADE) ×2 IMPLANT
BLADE SURG 15 STRL SS (BLADE) ×3
BNDG CMPR 9X4 STRL LF SNTH (GAUZE/BANDAGES/DRESSINGS) ×2
BNDG ESMARK 4X9 LF (GAUZE/BANDAGES/DRESSINGS) ×3 IMPLANT
CANISTER SUCTION 1200CC (MISCELLANEOUS) IMPLANT
CLOTH BEACON ORANGE TIMEOUT ST (SAFETY) ×3 IMPLANT
CORDS BIPOLAR (ELECTRODE) ×3 IMPLANT
COVER TABLE BACK 60X90 (DRAPES) ×3 IMPLANT
CUFF TOURNIQUET SINGLE 18IN (TOURNIQUET CUFF) ×3 IMPLANT
DECANTER SPIKE VIAL GLASS SM (MISCELLANEOUS) IMPLANT
DRAPE EXTREMITY T 121X128X90 (DRAPE) ×3 IMPLANT
DRAPE OEC MINIVIEW 54X84 (DRAPES) ×3 IMPLANT
DRAPE SURG 17X23 STRL (DRAPES) ×3 IMPLANT
DURAPREP 26ML APPLICATOR (WOUND CARE) ×3 IMPLANT
ELECT REM PT RETURN 9FT ADLT (ELECTROSURGICAL)
ELECTRODE REM PT RTRN 9FT ADLT (ELECTROSURGICAL) IMPLANT
GAUZE SPONGE 4X4 12PLY STRL LF (GAUZE/BANDAGES/DRESSINGS) ×3 IMPLANT
GAUZE SPONGE 4X4 16PLY XRAY LF (GAUZE/BANDAGES/DRESSINGS) IMPLANT
GAUZE XEROFORM 1X8 LF (GAUZE/BANDAGES/DRESSINGS) IMPLANT
GLOVE BIO SURGEON STRL SZ8.5 (GLOVE) ×3 IMPLANT
GLOVE ECLIPSE 6.5 STRL STRAW (GLOVE) ×3 IMPLANT
GOWN PREVENTION PLUS XLARGE (GOWN DISPOSABLE) ×3 IMPLANT
GOWN PREVENTION PLUS XXLARGE (GOWN DISPOSABLE) ×3 IMPLANT
NEEDLE HYPO 25X1 1.5 SAFETY (NEEDLE) ×3 IMPLANT
NS IRRIG 1000ML POUR BTL (IV SOLUTION) ×3 IMPLANT
PACK BASIN DAY SURGERY FS (CUSTOM PROCEDURE TRAY) ×3 IMPLANT
PAD CAST 3X4 CTTN HI CHSV (CAST SUPPLIES) ×4 IMPLANT
PAD CAST 4YDX4 CTTN HI CHSV (CAST SUPPLIES) IMPLANT
PADDING CAST ABS 4INX4YD NS (CAST SUPPLIES) ×1
PADDING CAST ABS COTTON 4X4 ST (CAST SUPPLIES) ×2 IMPLANT
PADDING CAST COTTON 3X4 STRL (CAST SUPPLIES) ×6
PADDING CAST COTTON 4X4 STRL (CAST SUPPLIES)
PEG SUBCHONDRAL SMOOTH 2.0X20 (Peg) ×6 IMPLANT
PEG SUBCHONDRAL SMOOTH 2.0X22 (Peg) ×15 IMPLANT
PENCIL BUTTON HOLSTER BLD 10FT (ELECTRODE) IMPLANT
PLATE STAN 24.4X59.5 RT (Plate) ×3 IMPLANT
SCREW BN 12X3.5XNS CORT TI (Screw) ×4 IMPLANT
SCREW CORT 3.5X10 LNG (Screw) ×3 IMPLANT
SCREW CORT 3.5X12 (Screw) ×6 IMPLANT
SHEET MEDIUM DRAPE 40X70 STRL (DRAPES) ×3 IMPLANT
SPLINT PLASTER CAST XFAST 3X15 (CAST SUPPLIES) IMPLANT
SPLINT PLASTER CAST XFAST 4X15 (CAST SUPPLIES) ×10 IMPLANT
SPLINT PLASTER XTRA FAST SET 4 (CAST SUPPLIES) ×5
SPLINT PLASTER XTRA FASTSET 3X (CAST SUPPLIES)
SPONGE GAUZE 4X4 12PLY (GAUZE/BANDAGES/DRESSINGS) ×3 IMPLANT
STOCKINETTE 4X48 STRL (DRAPES) ×3 IMPLANT
STRIP CLOSURE SKIN 1/2X4 (GAUZE/BANDAGES/DRESSINGS) ×3 IMPLANT
SUCTION FRAZIER TIP 10 FR DISP (SUCTIONS) IMPLANT
SUT ETHILON 4 0 PS 2 18 (SUTURE) IMPLANT
SUT MERSILENE 4 0 P 3 (SUTURE) IMPLANT
SUT PROLENE 3 0 PS 2 (SUTURE) ×3 IMPLANT
SUT SILK 2 0 FS (SUTURE) IMPLANT
SUT VIC AB 2-0 PS2 27 (SUTURE) ×3 IMPLANT
SUT VIC AB 3-0 FS2 27 (SUTURE) IMPLANT
SUT VICRYL RAPIDE 4/0 PS 2 (SUTURE) IMPLANT
SYR BULB 3OZ (MISCELLANEOUS) ×3 IMPLANT
SYRINGE 10CC LL (SYRINGE) ×3 IMPLANT
TOWEL OR 17X24 6PK STRL BLUE (TOWEL DISPOSABLE) ×3 IMPLANT
TUBE CONNECTING 20X1/4 (TUBING) IMPLANT
UNDERPAD 30X30 INCONTINENT (UNDERPADS AND DIAPERS) ×3 IMPLANT
WATER STERILE IRR 1000ML POUR (IV SOLUTION) IMPLANT

## 2011-09-22 NOTE — H&P (Signed)
Alyssa Whitehead is an 21 y.o. female.   Chief Complaint: right wrist pain HPI: 21 y/o female s/p fall off horse with right distal fracture  Past Medical History  Diagnosis Date  . No pertinent past medical history   . H/O hiatal hernia   . Radius distal fracture LT    FELL OFF HORSE 09-18-11  LT    Past Surgical History  Procedure Date  . No past surgeries     Family History  Problem Relation Age of Onset  . Anesthesia problems Neg Hx   . Hypotension Neg Hx   . Malignant hyperthermia Neg Hx   . Pseudochol deficiency Neg Hx    Social History:  reports that she has been smoking Cigarettes.  She has been smoking about 1 pack per day. She has never used smokeless tobacco. She reports that she drinks alcohol. She reports that she does not use illicit drugs.  Allergies: No Known Allergies  Medications Prior to Admission  Medication Dose Route Frequency Provider Last Rate Last Dose  . ceFAZolin (ANCEF) IVPB 1 g/50 mL premix  1 g Intravenous 60 min Pre-Op Marlowe Shores, MD      . chlorhexidine (HIBICLENS) 4 % liquid 4 application  60 mL Topical Once Marlowe Shores, MD      . fentaNYL (SUBLIMAZE) injection 50-100 mcg  50-100 mcg Intravenous PRN Germaine Pomfret, MD   50 mcg at 09/22/11 0902  . lactated ringers infusion   Intravenous Continuous Hart Robinsons, MD 10 mL/hr at 09/22/11 [phone]    . midazolam (VERSED) injection 1-2 mg  1-2 mg Intravenous PRN E. Jairo Ben, MD   1 mg at 09/22/11 0910   Medications Prior to Admission  Medication Sig Dispense Refill  . diazepam (VALIUM) 5 MG tablet Take 1 tablet (5 mg total) by mouth 2 (two) times daily.  15 tablet  0  . Levonorgest-Eth Estrad 91-Day (SEASONIQUE PO) Take 1 tablet by mouth daily.       Marland Kitchen oxyCODONE-acetaminophen (PERCOCET) 5-325 MG per tablet Take 1 tablet by mouth every 4 (four) hours as needed for pain.  30 tablet  0    Results for orders placed during the hospital encounter of 09/22/11 (from the past 48  hour(s))  POCT HEMOGLOBIN-HEMACUE     Status: Normal   Collection Time   09/22/11  8:22 AM      Component Value Range Comment   Hemoglobin 13.4  12.0 - 15.0 (g/dL)    No results found.  Review of Systems  Constitutional: Negative.   HENT: Negative.   Eyes: Negative.   Respiratory: Negative.   Cardiovascular: Negative.   Gastrointestinal: Negative.   Genitourinary: Negative.   Musculoskeletal: Negative.   Skin: Negative.   Neurological: Negative.   Endo/Heme/Allergies: Negative.   Psychiatric/Behavioral: Negative.     Blood pressure 124/52, pulse 101, temperature 98.4 F (36.9 C), resp. rate 16, height 5\' 3"  (1.6 m), weight 58.968 kg (130 lb), last menstrual period 09/21/2011, SpO2 98.00%, not currently breastfeeding. Physical Exam  Constitutional: She is oriented to person, place, and time. She appears well-developed and well-nourished.  HENT:  Head: Normocephalic and atraumatic.  Cardiovascular: Normal rate.   Respiratory: Effort normal.  Musculoskeletal:       Right wrist: She exhibits decreased range of motion, tenderness, bony tenderness, swelling and deformity.  Neurological: She is alert and oriented to person, place, and time.  Skin: Skin is warm.  Psychiatric: She has a normal mood and affect. Her  speech is normal and behavior is normal. Thought content normal.     Assessment/Plan 21 y/o female with displaced right radius fracture  Plan orif  Alyssa Whitehead A 09/22/2011, 9:30 AM

## 2011-09-22 NOTE — Anesthesia Preprocedure Evaluation (Signed)
Anesthesia Evaluation  Patient identified by MRN, date of birth, ID band Patient awake    Reviewed: Allergy & Precautions, H&P , NPO status , Patient's Chart, lab work & pertinent test results  History of Anesthesia Complications Negative for: history of anesthetic complications  Airway Mallampati: II TM Distance: >3 FB Neck ROM: Full  Mouth opening: Limited Mouth Opening  Dental No notable dental hx. (+) Teeth Intact and Dental Advisory Given   Pulmonary Current Smoker,  breath sounds clear to auscultation  Pulmonary exam normal       Cardiovascular negative cardio ROS  Rhythm:Regular Rate:Normal     Neuro/Psych negative neurological ROS  negative psych ROS   GI/Hepatic Neg liver ROS, GERD-  Controlled,  Endo/Other  negative endocrine ROS  Renal/GU negative Renal ROS     Musculoskeletal   Abdominal   Peds  Hematology negative hematology ROS (+)   Anesthesia Other Findings   Reproductive/Obstetrics LMP presently, patient states no chance is pregnant                           Anesthesia Physical Anesthesia Plan  ASA: II  Anesthesia Plan: General   Post-op Pain Management:    Induction: Intravenous  Airway Management Planned: LMA  Additional Equipment:   Intra-op Plan:   Post-operative Plan:   Informed Consent: I have reviewed the patients History and Physical, chart, labs and discussed the procedure including the risks, benefits and alternatives for the proposed anesthesia with the patient or authorized representative who has indicated his/her understanding and acceptance.   Dental advisory given  Plan Discussed with: CRNA and Surgeon  Anesthesia Plan Comments: (Plan routine monitors, GA- LMA OK, axillary block for post-op analgesia)        Anesthesia Quick Evaluation

## 2011-09-22 NOTE — Brief Op Note (Signed)
09/22/2011  10:27 AM  PATIENT:  Alyssa Whitehead  21 y.o. female  PRE-OPERATIVE DIAGNOSIS:  right distal radius fracture, right cts  POST-OPERATIVE DIAGNOSIS:  right distal radius fracture, right cts  PROCEDURE:  Procedure(s) (LRB): OPEN REDUCTION INTERNAL FIXATION (ORIF) DISTAL RADIAL FRACTURE (Right)  SURGEON:  Surgeon(s) and Role:    * Marlowe Shores, MD - Primary  PHYSICIAN ASSISTANT:   ASSISTANTS: none   ANESTHESIA:   general  EBL:  Total I/O In: 1000 [I.V.:1000] Out: -   BLOOD ADMINISTERED:none  DRAINS: none   LOCAL MEDICATIONS USED:  NONE  SPECIMEN:  No Specimen  DISPOSITION OF SPECIMEN:  N/A  COUNTS:  YES  TOURNIQUET:   Total Tourniquet Time Documented: Upper Arm (Right) - 31 minutes  DICTATION: .Other Dictation: Dictation Number [id number]  PLAN OF CARE: Discharge to home after PACU  PATIENT DISPOSITION:  PACU - hemodynamically stable.   Delay start of Pharmacological VTE agent (>24hrs) due to surgical blood loss or risk of bleeding: not applicable

## 2011-09-22 NOTE — Transfer of Care (Signed)
Immediate Anesthesia Transfer of Care Note  Patient: Alyssa Whitehead  Procedure(s) Performed: Procedure(s) (LRB): OPEN REDUCTION INTERNAL FIXATION (ORIF) DISTAL RADIAL FRACTURE (Right)  Patient Location: PACU  Anesthesia Type: GA combined with regional for post-op pain  Level of Consciousness: sedated  Airway & Oxygen Therapy: Patient Spontanous Breathing and Patient connected to face mask oxygen  Post-op Assessment: Report given to PACU RN and Post -op Vital signs reviewed and stable  Post vital signs: Reviewed and stable  Complications: No apparent anesthesia complications

## 2011-09-22 NOTE — Op Note (Signed)
See dictated note [phone]

## 2011-09-22 NOTE — Anesthesia Procedure Notes (Addendum)
Anesthesia Regional Block:  Axillary brachial plexus block  Pre-Anesthetic Checklist: ,, timeout performed, Correct Patient, Correct Site, Correct Laterality, Correct Procedure, Correct Position, site marked, Risks and benefits discussed,  Surgical consent,  Pre-op evaluation,  At surgeon's request and post-op pain management  Laterality: Right  Prep: chloraprep       Needles:   Needle Type: Other   (short "B" bevel)    Needle Gauge: 22 and 22 G    Additional Needles:  Procedures: paresthesia technique Axillary brachial plexus block  Nerve Stimulator or Paresthesia:  Response: transient median nerve paresthesia,  Response: transient ulnar nerve paresthesia,   Additional Responses:   Narrative:  Start time: 09/22/2011 9:01 AM End time: 09/22/2011 9:13 AM Injection made incrementally with aspirations every 5 mL.  Performed by: Personally  Anesthesiologist: Sandford Craze, MD  Additional Notes: Pt identified in Holding room.  Monitors applied. Working IV access confirmed. Sterile prep R axilla.  #22ga "B" bevel to transient median and ulnar nerve paresthesiae.  Total 30cc 0.5% Bupivacaine with 1:200k epi and 15cc 1.5% Lidocaine with 1:200k epi injected incrementally after negative test dose, distributed to both paresthesiae. Additional 5cc 1.5% lidocaine with 1:200k epi subq for intercostobrachial supplementation.  Patient asymptomatic, VSS, no heme aspirated, tolerated well.   Sandford Craze, MD   Procedure Name: LMA Insertion Date/Time: 09/22/2011 9:43 AM Performed by: Caren Macadam Pre-anesthesia Checklist: Patient identified, Emergency Drugs available, Suction available and Patient being monitored Patient Re-evaluated:Patient Re-evaluated prior to inductionOxygen Delivery Method: Circle System Utilized Preoxygenation: Pre-oxygenation with 100% oxygen Intubation Type: IV induction Ventilation: Mask ventilation without difficulty LMA: LMA inserted LMA Size: 4.0 Number of  attempts: 1 Placement Confirmation: positive ETCO2 and breath sounds checked- equal and bilateral Tube secured with: Tape Dental Injury: Teeth and Oropharynx as per pre-operative assessment

## 2011-09-22 NOTE — Anesthesia Postprocedure Evaluation (Signed)
  Anesthesia Post-op Note  Patient: Alyssa Whitehead  Procedure(s) Performed: Procedure(s) (LRB): OPEN REDUCTION INTERNAL FIXATION (ORIF) DISTAL RADIAL FRACTURE (Right)  Patient Location: PACU  Anesthesia Type: GA combined with regional for post-op pain  Level of Consciousness: awake, alert  and oriented  Airway and Oxygen Therapy: Patient Spontanous Breathing  Post-op Pain: none  Post-op Assessment: Post-op Vital signs reviewed, Patient's Cardiovascular Status Stable, Respiratory Function Stable, Patent Airway, No signs of Nausea or vomiting and Pain level controlled  Post-op Vital Signs: Reviewed and stable  Complications: No apparent anesthesia complications

## 2011-09-22 NOTE — Op Note (Signed)
NAME:  Alyssa Whitehead, Alyssa Whitehead NO.:  MEDICAL RECORD NO.:  [id number]  LOCATION:                                 FACILITY:  PHYSICIAN:  Artist Pais. Calianna Kim, M.D.DATE OF BIRTH:  [birth year]  DATE OF PROCEDURE:  09/22/2011 DATE OF DISCHARGE:                              OPERATIVE REPORT   PREOPERATIVE DIAGNOSIS:  Displaced distal radius fracture, right side.  POSTOPERATIVE DIAGNOSIS:  Displaced distal radius fracture, right side.  PROCEDURES:  Open reduction and internal fixation of above with DVR plate and screws, carpal tunnel release.  SURGEON:  Artist Pais. Mina Marble, MD  ASSISTANT:  None.  ANESTHESIA:  Block and general.  TOURNIQUET TIME:  31 minutes.  No complication.  No drains.  The patient was taken to operating suite after the induction of general anesthesia and actually block anesthesia.  Right upper extremity was prepped and draped in sterile fashion.  An Esmarch was used to exsanguinate the limb.  Tourniquet inflated to 250 mmHg.  At this point in time, an incision was made on the volar aspect of the distal radius in the distal forearm area on the right side, skin was incised.  The sheath overlying the FCR was incised.  The FCR was tracked in the midline, radial artery on the lateral side.  An interval was developed down the level of pronator quadratus with subperiosteal stripped off the volar aspect of the distal radius revealing a distal radius fracture, which was shortened and angulated.  The brachioradialis was released as well as identification and release of the 1st dorsal compartment.  Once this was done, a longitudinal traction, flexion, ulnar deviation was used to reduce the fracture.  A standard DVR plate was then passed from the volar aspect of the distal radius through the slotted hole in the plate, and under direct and fluoroscopic guidance, adequate position was obtained.  Once this was done, 2 more cortical screws were  placed proximally, followed by smooth pegs distally under direct and fluoroscopic guidance.  Intraoperative fluoroscopy revealed adequate reduction in AP, lateral, and oblique view.  The wound was then thoroughly irrigated.  The median nerve was identified in the proximal aspect of the wound tracing the carpal canal.  Path was created in dorsal and volar of the transverse carpal ligament, was then divided under direct vision using a curved blunt scissor decompressing the nerve.  The wound was then closed in layers of 2- 0 undyed Vicryl to reapproximate the pronator quadratus and a 3-0 Prolene subcuticular stitch on the skin.  Steri-Strips, 4x4s, fluffs, and a volar splint was applied.  The patient tolerated the procedure well and went to recovery room in stable fashion.     Artist Pais Mina Marble, M.D.     MAW/MEDQ  D:  09/22/2011  T:  09/22/2011  Job:  [id number]

## 2011-09-22 NOTE — Discharge Instructions (Signed)
Forearm Fracture Your caregiver has diagnosed you as having a broken bone (fracture) of the forearm. This is the part of your arm between the elbow and your wrist. Your forearm is made up of two bones. These are the radius and ulna. A fracture is a break in one or both bones. A cast or splint is used to protect and keep your injured bone from moving. The cast or splint will be on generally for about 5 to 6 weeks, with individual variations. HOME CARE INSTRUCTIONS   Keep the injured part elevated while sitting or lying down. Keeping the injury above the level of your heart (the center of the chest). This will decrease swelling and pain.   Apply ice to the injury for 15 to 20 minutes, 3 to 4 times per day while awake, for 2 days. Put the ice in a plastic bag and place a thin towel between the bag of ice and your cast or splint.   If you have a plaster or fiberglass cast:   Do not try to scratch the skin under the cast using sharp or pointed objects.   Check the skin around the cast every day. You may put lotion on any red or sore areas.   Keep your cast dry and clean.   If you have a plaster splint:   Wear the splint as directed.   You may loosen the elastic around the splint if your fingers become numb, tingle, or turn cold or blue.   Do not put pressure on any part of your cast or splint. It may break. Rest your cast only on a pillow the first 24 hours until it is fully hardened.   Your cast or splint can be protected during bathing with a plastic bag. Do not lower the cast or splint into water.   Only take over-the-counter or prescription medicines for pain, discomfort, or fever as directed by your caregiver.  SEEK IMMEDIATE MEDICAL CARE IF:   Your cast gets damaged or breaks.   You have more severe pain or swelling than you did before the cast.   Your skin or nails below the injury turn blue or gray, or feel cold or numb.   There is a bad smell or new stains and/or pus like  (purulent) drainage coming from under the cast.  MAKE SURE YOU:   Understand these instructions.   Will watch your condition.   Will get help right away if you are not doing well or get worse.  Document Released: 06/25/2000 Document Revised: 06/17/2011 Document Reviewed: 02/15/2008 [facility] Patient Information 2012 [place], [place].   Regional Anesthesia Blocks  1. Numbness or the inability to move the "blocked" extremity may last from 3-48 hours after placement. The length of time depends on the medication injected and your individual response to the medication. If the numbness is not going away after 48 hours, call your surgeon.  2. The extremity that is blocked will need to be protected until the numbness is gone and the  Strength has returned. Because you cannot feel it, you will need to take extra care to avoid injury. Because it may be weak, you may have difficulty moving it or using it. You may not know what position it is in without looking at it while the block is in effect.  3. For blocks in the legs and feet, returning to weight bearing and walking needs to be done carefully. You will need to wait until the numbness is entirely gone  and the strength has returned. You should be able to move your leg and foot normally before you try and bear weight or walk. You will need someone to be with you when you first try to ensure you do not fall and possibly risk injury.  4. Bruising and tenderness at the needle site are common side effects and will resolve in a few days.  5. Persistent numbness or new problems with movement should be communicated to the surgeon or the [facility] [phone]).   Post Anesthesia Home Care Instructions  Activity: Get plenty of rest for the remainder of the day. A responsible adult should stay with you for 24 hours following the procedure.  For the next 24 hours, DO NOT: -Drive a car -Advertising copywriter -Drink alcoholic beverages -Take  any medication unless instructed by your physician -Make any legal decisions or sign important papers.  Meals: Start with liquid foods such as gelatin or soup. Progress to regular foods as tolerated. Avoid greasy, spicy, heavy foods. If nausea and/or vomiting occur, drink only clear liquids until the nausea and/or vomiting subsides. Call your physician if vomiting continues.  Special Instructions/Symptoms: Your throat may feel dry or sore from the anesthesia or the breathing tube placed in your throat during surgery. If this causes discomfort, gargle with warm salt water. The discomfort should disappear within 24 hours.  [facility]  [address] [place], [place] [address] [phone]  [facility] [address] [place], [place] [address] [phone]  Discharge Instructions After Orthopedic Procedures:  *You may feel tired and weak following your procedure. It is recommended that you limit physical activity for the next 24 hours and rest at home for the remainder of today and tomorrow. *No strenuous activity should be started without your doctor's permission.  Elevate the extremity that you had surgery on to a level above your heart. This should continue for 48 hours or as instructed by your doctor.  If you had hand, arm or shoulder surgery you should move your fingers frequently unless otherwise instructed by your doctor.  If you had foot, knee or leg surgery you should wiggle your toes frequently unless otherwise instructed by your doctor.  Follow your doctor's exact instructions for activity at home. Use your home equipment as instructed. (Crutches, hard shoes, slings etc.)  Limit your activity as instructed by your doctor.  Report to your doctor should any of the following occur: 1. Extreme swelling of your fingers or toes. 2. Inability to wiggle your fingers or toes. 3. Coldness, pale or bluish color in your fingers or toes. 4. Loss of  sensation, numbness or tingling of your fingers or toes. 5. Unusual smell or odor from under your dressing or cast. 6. Excessive bleeding or drainage from the surgical site. 7. Pain not relieved by medication your doctor has prescribed for you. 8. Cast or dressing too tight (do not get your dressing or cast wet or put anything under          your dressing or cast.)  *Do not change your dressing unless instructed by your doctor or discharge nurse. Then follow exact instructions.  *Follow labeled instructions for any medications that your doctor may have prescribed for you. *Should any questions or complications develop following your procedure, PLEASE CONTACT YOUR DOCTOR.

## 2011-09-22 NOTE — Progress Notes (Signed)
Assisted Dr. Jackson with right, axillary block. Side rails up, monitors on throughout procedure. See vital signs in flow sheet. Tolerated Procedure well. 

## 2012-10-30 ENCOUNTER — Ambulatory Visit: Payer: Self-pay | Admitting: Obstetrics & Gynecology

## 2012-10-31 ENCOUNTER — Encounter: Payer: Self-pay | Admitting: Obstetrics

## 2013-01-31 ENCOUNTER — Ambulatory Visit (INDEPENDENT_AMBULATORY_CARE_PROVIDER_SITE_OTHER): Payer: Medicaid Other | Admitting: Obstetrics & Gynecology

## 2013-01-31 ENCOUNTER — Encounter: Payer: Self-pay | Admitting: Obstetrics & Gynecology

## 2013-01-31 VITALS — BP 114/75 | HR 101 | Temp 98.5°F | Ht 63.0 in | Wt 136.0 lb

## 2013-01-31 DIAGNOSIS — B373 Candidiasis of vulva and vagina: Secondary | ICD-10-CM

## 2013-01-31 DIAGNOSIS — Z720 Tobacco use: Secondary | ICD-10-CM | POA: Insufficient documentation

## 2013-01-31 DIAGNOSIS — Z01419 Encounter for gynecological examination (general) (routine) without abnormal findings: Secondary | ICD-10-CM

## 2013-01-31 DIAGNOSIS — F17200 Nicotine dependence, unspecified, uncomplicated: Secondary | ICD-10-CM

## 2013-01-31 DIAGNOSIS — Z0000 Encounter for general adult medical examination without abnormal findings: Secondary | ICD-10-CM

## 2013-01-31 LAB — POCT WET PREP (WET MOUNT): Clue Cells Wet Prep Whiff POC: NEGATIVE

## 2013-01-31 MED ORDER — TERCONAZOLE 0.4 % VA CREA: 1.0000 | TOPICAL_CREAM | Freq: Every day | VAGINAL | Status: DC

## 2013-01-31 NOTE — Patient Instructions (Signed)
Candidal Vulvovaginitis  Candidal vulvovaginitis is an infection of the vagina and vulva. The vulva is the skin around the opening of the vagina. This may cause itching and discomfort in and around the vagina.   HOME CARE  · Only take medicine as told by your doctor.  · Do not have sex (intercourse) until the infection is healed or as told by your doctor.  · Practice safe sex.  · Tell your sex partner about your infection.  · Do not douche or use tampons.  · Wear cotton underwear. Do not wear tight pants or panty hose.  · Eat yogurt. This may help treat and prevent yeast infections.  GET HELP RIGHT AWAY IF:   · You have a fever.  · Your problems get worse during treatment or do not get better in 3 days.  · You have discomfort, irritation, or itching in your vagina or vulva area.  · You have pain after sex.  · You start to get belly (abdominal) pain.  MAKE SURE YOU:  · Understand these instructions.  · Will watch your condition.  · Will get help right away if you are not doing well or get worse.  Document Released: 09/24/2008 Document Revised: 09/20/2011 Document Reviewed: 09/24/2008  [employer]® Patient Information ©2014 [employer].

## 2013-01-31 NOTE — Progress Notes (Signed)
Subjective:     Alyssa Whitehead is a 22 y.o. female here for a routine exam.  Current complaints: annual exam. Pt sates she has no other concerns at this time.   Personal health questionnaire reviewed: yes.   Gynecologic History Patient's last menstrual period was 12/01/2012. Contraception: OCP (estrogen/progesterone) Last Pap: 2012. Results were: normal   Obstetric History OB History   Grav Para Term Preterm Abortions TAB SAB Ect Mult Living   1 1 1       1      # Outc Date GA Lbr Len/2nd Wgt Sex Del Anes PTL Lv   1 TRM 10/12 [redacted]w[redacted]d 26:31 / 02:12 7lb(3.175kg) F SVD EPI  Yes       The following portions of the patient's history were reviewed and updated as appropriate: allergies, current medications, past family history, past medical history, past social history, past surgical history and problem list.  Review of Systems Pertinent items are noted in HPI.    Objective:      General appearance: alert Breasts: normal appearance, no masses or tenderness Abdomen: soft, non-tender; bowel sounds normal; no masses,  no organomegaly Pelvic: cervix normal in appearance, external genitalia normal, no adnexal masses or tenderness, uterus normal size, shape, and consistency and vagina with white discharge    Assessment:    Healthy female exam.  ?Candida vulvovaginitis   Plan:   Continue COCP Terazol

## 2013-02-01 LAB — WET PREP BY MOLECULAR PROBE: Trichomonas vaginosis: NEGATIVE

## 2013-02-02 LAB — PAP IG AND CT-NG NAA

## 2013-09-05 ENCOUNTER — Other Ambulatory Visit: Payer: Self-pay | Admitting: Obstetrics & Gynecology

## 2013-12-11 ENCOUNTER — Other Ambulatory Visit: Payer: Self-pay | Admitting: Obstetrics & Gynecology

## 2014-04-02 ENCOUNTER — Other Ambulatory Visit: Payer: Self-pay | Admitting: Obstetrics

## 2014-04-02 ENCOUNTER — Ambulatory Visit (INDEPENDENT_AMBULATORY_CARE_PROVIDER_SITE_OTHER): Payer: Medicaid Other | Admitting: Obstetrics

## 2014-04-02 ENCOUNTER — Encounter: Payer: Self-pay | Admitting: Obstetrics

## 2014-04-02 VITALS — BP 117/79 | HR 97 | Temp 97.1°F | Ht 63.0 in | Wt 147.0 lb

## 2014-04-02 DIAGNOSIS — B9689 Other specified bacterial agents as the cause of diseases classified elsewhere: Secondary | ICD-10-CM

## 2014-04-02 DIAGNOSIS — N760 Acute vaginitis: Secondary | ICD-10-CM

## 2014-04-02 DIAGNOSIS — Z3202 Encounter for pregnancy test, result negative: Secondary | ICD-10-CM

## 2014-04-02 DIAGNOSIS — J302 Other seasonal allergic rhinitis: Secondary | ICD-10-CM

## 2014-04-02 DIAGNOSIS — Z0000 Encounter for general adult medical examination without abnormal findings: Secondary | ICD-10-CM

## 2014-04-02 DIAGNOSIS — Z01419 Encounter for gynecological examination (general) (routine) without abnormal findings: Secondary | ICD-10-CM

## 2014-04-02 DIAGNOSIS — Z3041 Encounter for surveillance of contraceptive pills: Secondary | ICD-10-CM

## 2014-04-02 DIAGNOSIS — B373 Candidiasis of vulva and vagina: Secondary | ICD-10-CM

## 2014-04-02 DIAGNOSIS — B3731 Acute candidiasis of vulva and vagina: Secondary | ICD-10-CM

## 2014-04-02 HISTORY — DX: Other seasonal allergic rhinitis: J30.2

## 2014-04-02 MED ORDER — LEVONORGEST-ETH ESTRAD 91-DAY 0.15-0.03 &0.01 MG PO TABS: ORAL_TABLET | ORAL | Status: DC

## 2014-04-02 MED ORDER — LORATADINE 10 MG PO TABS: 10.0000 mg | ORAL_TABLET | Freq: Every day | ORAL | Status: DC

## 2014-04-02 MED ORDER — FLUCONAZOLE 150 MG PO TABS: 150.0000 mg | ORAL_TABLET | Freq: Once | ORAL | Status: DC

## 2014-04-02 MED ORDER — CLINDAMYCIN HCL 300 MG PO CAPS: 300.0000 mg | ORAL_CAPSULE | Freq: Three times a day (TID) | ORAL | Status: DC

## 2014-04-03 ENCOUNTER — Other Ambulatory Visit: Payer: Self-pay | Admitting: Obstetrics

## 2014-04-03 LAB — GC/CHLAMYDIA PROBE AMP
CT Probe RNA: NEGATIVE
GC Probe RNA: NEGATIVE

## 2014-04-03 LAB — WET PREP BY MOLECULAR PROBE
Candida species: NEGATIVE
GARDNERELLA VAGINALIS: POSITIVE — AB
Trichomonas vaginosis: NEGATIVE

## 2014-04-03 NOTE — Progress Notes (Signed)
Subjective:     Alyssa Whitehead is a 23 y.o. female here for a routine exam.  Current complaints: None.    Personal health questionnaire:  Is patient Ashkenazi Jewish, have a family history of breast and/or ovarian cancer: no Is there a family history of uterine cancer diagnosed at age < 54, gastrointestinal cancer, urinary tract cancer, family member who is a Personnel officer syndrome-associated carrier: no Is the patient overweight and hypertensive, family history of diabetes, personal history of gestational diabetes or PCOS: no Is patient over 31, have PCOS,  family history of premature CHD under age 61, diabetes, smoke, have hypertension or peripheral artery disease:  no At any time, has a partner hit, kicked or otherwise hurt or frightened you?: no Over the past 2 weeks, have you felt down, depressed or hopeless?: no Over the past 2 weeks, have you felt little interest or pleasure in doing things?:no   Gynecologic History Patient's last menstrual period was 03/19/2014. Contraception: OCP (estrogen/progesterone) Last Pap: 2014. Results were: abnormal ( ASCUS ) Last mammogram: n/a. Results were: n/a  Obstetric History OB History  Gravida Para Term Preterm AB SAB TAB Ectopic Multiple Living  # Outcome Date GA Lbr Len/2nd Weight Sex Delivery Anes PTL Lv  1 TRM 05/12/11 [redacted]w[redacted]d 26:31 / 02:12 7 lb (3.175 kg) F SVD EPI  Y      Past Medical History  Diagnosis Date  . No pertinent past medical history   . H/O hiatal hernia   . Radius distal fracture LT    FELL OFF HORSE 09-18-11  LT    Past Surgical History  Procedure Laterality Date  . No past surgeries      Current outpatient prescriptions:Levonorgestrel-Ethinyl Estradiol (CAMRESE) 0.15-0.03 &0.01 MG tablet, USE AS DIRECTED, Disp: 91 tablet, Rfl: 3;  clindamycin (CLEOCIN) 300 MG capsule, Take 1 capsule (300 mg total) by mouth 3 (three) times daily., Disp: 21 capsule, Rfl: 2;  fluconazole (DIFLUCAN) 150 MG tablet, Take 1 tablet  (150 mg total) by mouth once., Disp: 1 tablet, Rfl: 2 loratadine (CLARITIN) 10 MG tablet, Take 1 tablet (10 mg total) by mouth daily., Disp: 30 tablet, Rfl: 11;  [DISCONTINUED] norethindrone (ORTHO MICRONOR) 0.35 MG tablet, Take 1 tablet (0.35 mg total) by mouth daily., Disp: 28 tablet, Rfl: 11 No Known Allergies  History  Substance Use Topics  . Smoking status: Current Every Day Smoker -- 0.50 packs/day    Types: Cigarettes  . Smokeless tobacco: Never Used  . Alcohol Use: Yes     Comment: ocassionally     Family History  Problem Relation Age of Onset  . Anesthesia problems Neg Hx   . Hypotension Neg Hx   . Malignant hyperthermia Neg Hx   . Pseudochol deficiency Neg Hx   . Cervical cancer Mother       Review of Systems  Constitutional: negative for fatigue and weight loss Respiratory: negative for cough and wheezing Cardiovascular: negative for chest pain, fatigue and palpitations Gastrointestinal: negative for abdominal pain and change in bowel habits Musculoskeletal:negative for myalgias Neurological: negative for gait problems and tremors Behavioral/Psych: negative for abusive relationship, depression Endocrine: negative for temperature intolerance   Genitourinary:negative for abnormal menstrual periods, genital lesions, hot flashes, sexual problems and vaginal discharge Integument/breast: negative for breast lump, breast tenderness, nipple discharge and skin lesion(s)    Objective:       BP 117/79  Pulse 97  Temp(Src) 97.1 F (  36.2 C)  Ht  (1.6 m)  Wt 147 lb (66.679 kg)  BMI 26.05 kg/m2  LMP 03/19/2014 General:   alert  Skin:   no rash or abnormalities  Lungs:   clear to auscultation bilaterally  Heart:   regular rate and rhythm, S1, S2 normal, no murmur, click, rub or gallop  Breasts:   normal without suspicious masses, skin or nipple changes or axillary nodes  Abdomen:  normal findings: no organomegaly, soft, non-tender and no hernia  Pelvis:  External  genitalia: normal general appearance Urinary system: urethral meatus normal and bladder without fullness, nontender Vaginal: normal without tenderness, induration or masses Cervix: normal appearance Adnexa: normal bimanual exam Uterus: anteverted and non-tender, normal size   Lab Review Urine pregnancy test Labs reviewed yes Radiologic studies reviewed no    Assessment:    Healthy female exam.   H/O ASCUS Pap  BV  Seasonal allergies.  Contraceptive pill surveillance     Plan:    Education reviewed: safe sex/STD prevention and smoking cessation. Contraception: OCP (estrogen/progesterone). Follow up in: 1 year.   Meds ordered this encounter  Medications  . clindamycin (CLEOCIN) 300 MG capsule    Sig: Take 1 capsule (300 mg total) by mouth 3 (three) times daily.    Dispense:  21 capsule    Refill:  2  . DISCONTD: Levonorgestrel-Ethinyl Estradiol (CAMRESE) 0.15-0.03 &0.01 MG tablet    Sig: USE AS DIRECTED    Dispense:  91 tablet    Refill:  0  . Levonorgestrel-Ethinyl Estradiol (CAMRESE) 0.15-0.03 &0.01 MG tablet    Sig: USE AS DIRECTED    Dispense:  91 tablet    Refill:  3  . fluconazole (DIFLUCAN) 150 MG tablet    Sig: Take 1 tablet (150 mg total) by mouth once.    Dispense:  1 tablet    Refill:  2  . loratadine (CLARITIN) 10 MG tablet    Sig: Take 1 tablet (10 mg total) by mouth daily.    Dispense:  30 tablet    Refill:  11   Orders Placed This Encounter  Procedures  . WET PREP BY MOLECULAR PROBE  . GC/Chlamydia Probe Amp

## 2014-04-04 LAB — PAP IG W/ RFLX HPV ASCU

## 2014-05-13 ENCOUNTER — Encounter: Payer: Self-pay | Admitting: Obstetrics

## 2014-07-08 ENCOUNTER — Encounter: Payer: Self-pay | Admitting: *Deleted

## 2014-07-09 ENCOUNTER — Encounter: Payer: Self-pay | Admitting: Obstetrics & Gynecology

## 2014-09-18 ENCOUNTER — Other Ambulatory Visit: Payer: Self-pay | Admitting: Obstetrics

## 2014-12-23 ENCOUNTER — Other Ambulatory Visit: Payer: Self-pay | Admitting: Obstetrics

## 2015-04-01 ENCOUNTER — Other Ambulatory Visit: Payer: Self-pay | Admitting: Obstetrics

## 2015-06-13 ENCOUNTER — Encounter (HOSPITAL_BASED_OUTPATIENT_CLINIC_OR_DEPARTMENT_OTHER): Payer: Self-pay | Admitting: *Deleted

## 2015-06-13 ENCOUNTER — Emergency Department (HOSPITAL_BASED_OUTPATIENT_CLINIC_OR_DEPARTMENT_OTHER)
Admission: EM | Admit: 2015-06-13 | Discharge: 2015-06-13 | Disposition: A | Discharge status: Home / Self Care | Payer: Medicaid Other | Attending: Emergency Medicine | Admitting: Emergency Medicine

## 2015-06-13 ENCOUNTER — Emergency Department (HOSPITAL_BASED_OUTPATIENT_CLINIC_OR_DEPARTMENT_OTHER): Payer: Medicaid Other

## 2015-06-13 DIAGNOSIS — F17210 Nicotine dependence, cigarettes, uncomplicated: Secondary | ICD-10-CM | POA: Insufficient documentation

## 2015-06-13 DIAGNOSIS — J069 Acute upper respiratory infection, unspecified: Secondary | ICD-10-CM

## 2015-06-13 DIAGNOSIS — Z3202 Encounter for pregnancy test, result negative: Secondary | ICD-10-CM | POA: Insufficient documentation

## 2015-06-13 DIAGNOSIS — R05 Cough: Secondary | ICD-10-CM | POA: Diagnosis present

## 2015-06-13 DIAGNOSIS — Z8781 Personal history of (healed) traumatic fracture: Secondary | ICD-10-CM | POA: Insufficient documentation

## 2015-06-13 DIAGNOSIS — Z79899 Other long term (current) drug therapy: Secondary | ICD-10-CM | POA: Diagnosis not present

## 2015-06-13 DIAGNOSIS — Z8719 Personal history of other diseases of the digestive system: Secondary | ICD-10-CM | POA: Diagnosis not present

## 2015-06-13 LAB — PREGNANCY, URINE: PREG TEST UR: NEGATIVE

## 2015-06-13 MED ORDER — BENZONATATE 100 MG PO CAPS: 100.0000 mg | ORAL_CAPSULE | Freq: Three times a day (TID) | ORAL | Status: DC

## 2015-06-13 NOTE — Discharge Instructions (Signed)

## 2015-06-13 NOTE — ED Notes (Signed)
Pt amb to triage with quick steady gait in nad, mask in place. Pt reports cough and chest congestion x 2 weeks.

## 2015-06-13 NOTE — ED Provider Notes (Signed)
CSN: [id number]     Arrival date & time 06/13/15  1658 History  By signing my name below, I, Gwenyth Ober, attest that this documentation has been prepared under the direction and in the presence of Melene Plan, DO.  Electronically Signed: Gwenyth Ober, ED Scribe. 06/13/2015. 5:55 PM.   Chief Complaint  Patient presents with  . Cough   The history is provided by the patient. No language interpreter was used.    HPI Comments: Alyssa Whitehead is a 24 y.o. female who presents to the Emergency Department complaining of intermittent, moderate productive cough that started 2 weeks ago. She states congestion and rhinorrhea as associated symptoms. Pt is currently taking Clindamycin, which had been prescribed for a previous infection. She has sick contact with similar symptoms. Pt smokes cigarettes. She denies fever and difficulty breathing.  Past Medical History  Diagnosis Date  . No pertinent past medical history   . H/O hiatal hernia   . Radius distal fracture LT    FELL OFF HORSE 09-18-11  LT   Past Surgical History  Procedure Laterality Date  . No past surgeries     Family History  Problem Relation Age of Onset  . Anesthesia problems Neg Hx   . Hypotension Neg Hx   . Malignant hyperthermia Neg Hx   . Pseudochol deficiency Neg Hx   . Cervical cancer Mother    Social History  Substance Use Topics  . Smoking status: Current Every Day Smoker -- 0.50 packs/day    Types: Cigarettes  . Smokeless tobacco: Never Used  . Alcohol Use: Yes     Comment: ocassionally    OB History    Gravida Para Term Preterm AB TAB SAB Ectopic Multiple Living   Review of Systems  Constitutional: Negative for fever and chills.  HENT: Positive for congestion and rhinorrhea.   Eyes: Negative for redness and visual disturbance.  Respiratory: Positive for cough. Negative for shortness of breath and wheezing.   Cardiovascular: Negative for chest pain and palpitations.  Gastrointestinal:  Negative for nausea and vomiting.  Genitourinary: Negative for dysuria and urgency.  Musculoskeletal: Negative for myalgias and arthralgias.  Skin: Negative for pallor and wound.  Neurological: Negative for dizziness and headaches.   Allergies  Review of patient's allergies indicates no known allergies.  Home Medications   Prior to Admission medications   Medication Sig Start Date End Date Taking? Authorizing Provider  benzonatate (TESSALON) 100 MG capsule Take 1 capsule (100 mg total) by mouth every 8 (eight) hours. 06/13/15   Melene Plan, DO  CAMRESE 0.15-0.03 &0.01 MG tablet USE AS DIRECTED 04/01/15   Brock Bad, MD  fluconazole (DIFLUCAN) 150 MG tablet Take 1 tablet (150 mg total) by mouth once. 04/02/14   Brock Bad, MD  Levonorgestrel-Ethinyl Estradiol (CAMRESE) 0.15-0.03 &0.01 MG tablet USE AS DIRECTED 04/02/14   Brock Bad, MD  loratadine (CLARITIN) 10 MG tablet Take 1 tablet (10 mg total) by mouth daily. 04/02/14   Brock Bad, MD   BP 128/77 mmHg  Pulse 79  Temp(Src) 98.8 F (37.1 C) (Oral)  Resp 18  Ht  (1.6 m)  Wt 145 lb (65.772 kg)  BMI 25.69 kg/m2  SpO2 99%  LMP 04/13/2015 Physical Exam  Constitutional: She is oriented to person, place, and time. She appears well-developed and well-nourished. No distress.  HENT:  Head: Normocephalic and atraumatic.  Right Ear: Tympanic  membrane normal.  Left Ear: Tympanic membrane normal.  Mouth/Throat: Posterior oropharyngeal erythema (Mild) present. No posterior oropharyngeal edema.  Swollen turbinates; post-nasal drip  Eyes: EOM are normal. Pupils are equal, round, and reactive to light.  Neck: Normal range of motion. Neck supple.  Cardiovascular: Normal rate, regular rhythm and normal heart sounds.  Exam reveals no gallop and no friction rub.   No murmur heard. Pulmonary/Chest: Effort normal and breath sounds normal. No respiratory distress. She has no wheezes. She has no rales.  Abdominal: Soft. She  exhibits no distension. There is no tenderness.  Musculoskeletal: She exhibits no edema or tenderness.  Neurological: She is alert and oriented to person, place, and time.  Skin: Skin is warm and dry. She is not diaphoretic.  Psychiatric: She has a normal mood and affect. Her behavior is normal.  Nursing note and vitals reviewed.   ED Course  Procedures  DIAGNOSTIC STUDIES: Oxygen Saturation is 99% on RA, normal by my interpretation.    COORDINATION OF CARE: 5:56 PM Discussed x-ray results, suspicion for viral URI and treatment plan with pt which includes Tessalon Pearls. Pt agreed to plan.  Labs Review Labs Reviewed  PREGNANCY, URINE    Imaging Review Dg Chest 2 View  06/13/2015  CLINICAL DATA:  Cough, chest congestion for 2 weeks EXAM: CHEST  2 VIEW COMPARISON:  None. FINDINGS: The heart size and mediastinal contours are within normal limits. Both lungs are clear. The visualized skeletal structures are unremarkable. IMPRESSION: No active cardiopulmonary disease. Electronically Signed   By: Elige Ko   On: 06/13/2015 17:49   I have personally reviewed and evaluated these images and lab results as part of my medical decision-making.   EKG Interpretation None      MDM   Final diagnoses:  URI (upper respiratory infection)    24 yo F with a chief complaint of cough. This been going on for the past 2 weeks. Patient denies fevers or chills. Denies shortness of breath. Patient is well-appearing and nontoxic. Chest x-ray negative for pneumonia and viewed by me. This signs of sinusitis TMs normal bilaterally. Throat exam with no signs of bacterial infection. U pregnant negative. Patient will take Tylenol and Motrin at home for aches and pains. Encourage fluids. Given prescription for [employer]. Patient will follow-up with her family doctor in a week for recheck. If persistent cough then question if pertussis is possible etiology.  6:24 PM:  I have discussed the  diagnosis/risks/treatment options with the patient and believe the pt to be eligible for discharge home to follow-up with PCP. We also discussed returning to the ED immediately if new or worsening sx occur. We discussed the sx which are most concerning (e.g., sudden worsening sob, fever, inability to tolerate by mouth) that necessitate immediate return. Medications administered to the patient during their visit and any new prescriptions provided to the patient are listed below.  Medications given during this visit Medications - No data to display  Discharge Medication List as of 06/13/2015  5:58 PM    START taking these medications   Details  benzonatate (TESSALON) 100 MG capsule Take 1 capsule (100 mg total) by mouth every 8 (eight) hours., Starting 06/13/2015, Until Discontinued, Print        The patient appears reasonably screen and/or stabilized for discharge and I doubt any other medical condition or other [facility] requiring further screening, evaluation, or treatment in the ED at this time prior to discharge.    I personally performed the  services described in this documentation, which was scribed in my presence. The recorded information has been reviewed and is accurate.     Melene Planan Henrietta Cieslewicz, DO 06/13/15 1824

## 2015-06-20 ENCOUNTER — Ambulatory Visit: Payer: Medicaid Other | Admitting: Certified Nurse Midwife

## 2015-06-27 ENCOUNTER — Encounter: Payer: Self-pay | Admitting: Certified Nurse Midwife

## 2015-06-27 ENCOUNTER — Ambulatory Visit (INDEPENDENT_AMBULATORY_CARE_PROVIDER_SITE_OTHER): Payer: Medicaid Other | Admitting: Certified Nurse Midwife

## 2015-06-27 VITALS — BP 108/75 | HR 74 | Temp 99.1°F | Wt 149.0 lb

## 2015-06-27 DIAGNOSIS — Z0000 Encounter for general adult medical examination without abnormal findings: Secondary | ICD-10-CM

## 2015-06-27 DIAGNOSIS — Z01419 Encounter for gynecological examination (general) (routine) without abnormal findings: Secondary | ICD-10-CM

## 2015-06-27 DIAGNOSIS — Z113 Encounter for screening for infections with a predominantly sexual mode of transmission: Secondary | ICD-10-CM

## 2015-06-27 LAB — COMPREHENSIVE METABOLIC PANEL
ALBUMIN: 4 g/dL (ref 3.6–5.1)
ALT: 17 U/L (ref 6–29)
AST: 18 U/L (ref 10–30)
Alkaline Phosphatase: 42 U/L (ref 33–115)
BILIRUBIN TOTAL: 0.4 mg/dL (ref 0.2–1.2)
BUN: 7 mg/dL (ref 7–25)
CALCIUM: 8.7 mg/dL (ref 8.6–10.2)
CHLORIDE: 102 mmol/L (ref 98–110)
CO2: 23 mmol/L (ref 20–31)
CREATININE: 0.61 mg/dL (ref 0.50–1.10)
Glucose, Bld: 62 mg/dL — ABNORMAL LOW (ref 65–99)
Potassium: 4.1 mmol/L (ref 3.5–5.3)
Sodium: 135 mmol/L (ref 135–146)
TOTAL PROTEIN: 6.9 g/dL (ref 6.1–8.1)

## 2015-06-27 LAB — CBC WITH DIFFERENTIAL/PLATELET
BASOS ABS: 0 10*3/uL (ref 0.0–0.1)
BASOS PCT: 0 % (ref 0–1)
EOS ABS: 0.2 10*3/uL (ref 0.0–0.7)
EOS PCT: 2 % (ref 0–5)
HCT: 41.8 % (ref 36.0–46.0)
Hemoglobin: 14.1 g/dL (ref 12.0–15.0)
LYMPHS ABS: 2.8 10*3/uL (ref 0.7–4.0)
Lymphocytes Relative: 30 % (ref 12–46)
MCH: 29.9 pg (ref 26.0–34.0)
MCHC: 33.7 g/dL (ref 30.0–36.0)
MCV: 88.6 fL (ref 78.0–100.0)
MONOS PCT: 8 % (ref 3–12)
MPV: 10.3 fL (ref 8.6–12.4)
Monocytes Absolute: 0.7 10*3/uL (ref 0.1–1.0)
Neutro Abs: 5.6 10*3/uL (ref 1.7–7.7)
Neutrophils Relative %: 60 % (ref 43–77)
PLATELETS: 323 10*3/uL (ref 150–400)
RBC: 4.72 MIL/uL (ref 3.87–5.11)
RDW: 13.2 % (ref 11.5–15.5)
WBC: 9.3 10*3/uL (ref 4.0–10.5)

## 2015-06-27 LAB — TSH: TSH: 1.753 u[IU]/mL (ref 0.350–4.500)

## 2015-06-27 NOTE — Progress Notes (Signed)
Patient ID: Alyssa Whitehead, female   DOB: 11-Aug-1990, 24 y.o.   MRN: [id number]    Subjective:       Alyssa Loftaryn K Nghiem is a 24 y.o. female here for a routine exam.  Current complaints: none.  Needs birth control, irregular bleeding.  Camirse, generic of seasinique.  Monthly outside of 3 month OCPs.  Last about a week, denies any heavy bleeding, or clots.  Does have brown spotting currently.    Current half a pack a day smoker.  Works.  Desires full STD screening.    Personal health questionnaire:  Is patient Ashkenazi Jewish, have a family history of breast and/or ovarian cancer: no Is there a family history of uterine cancer diagnosed at age < 3150, gastrointestinal cancer, urinary tract cancer, family member who is a Personnel officerLynch syndrome-associated carrier: no Is the patient overweight and hypertensive, family history of diabetes, personal history of gestational diabetes, preeclampsia or PCOS: no Is patient over 5255, have PCOS,  family history of premature CHD under age 10365, diabetes, smoke, have hypertension or peripheral artery disease:  no At any time, has a partner hit, kicked or otherwise hurt or frightened you?: no Over the past 2 weeks, have you felt down, depressed or hopeless?: no Over the past 2 weeks, have you felt little interest or pleasure in doing things?:no   Gynecologic History Patient's last menstrual period was 04/13/2015. Contraception: OCP (estrogen/progesterone) Last Pap: unknown. Results were: normal according to the patient.  Last mammogram: N/A.   Obstetric History OB History  Gravida Para Term Preterm AB SAB TAB Ectopic Multiple Living  1 1 1       1     # Outcome Date GA Lbr Len/2nd Weight Sex Delivery Anes PTL Lv  1 Term 05/12/11 5381w4d 26:31 / 02:12 7 lb (3.175 kg) F Vag-Spont EPI  Y      Past Medical History  Diagnosis Date  . No pertinent past medical history   . H/O hiatal hernia   . Radius distal fracture LT    FELL OFF HORSE 09-18-11  LT    Past Surgical  History  Procedure Laterality Date  . No past surgeries       Current outpatient prescriptions:  .  Levonorgestrel-Ethinyl Estradiol (CAMRESE) 0.15-0.03 &0.01 MG tablet, USE AS DIRECTED, Disp: 91 tablet, Rfl: 3 .  [DISCONTINUED] norethindrone (ORTHO MICRONOR) 0.35 MG tablet, Take 1 tablet (0.35 mg total) by mouth daily., Disp: 28 tablet, Rfl: 11 No Known Allergies  Social History  Substance Use Topics  . Smoking status: Current Every Day Smoker -- 0.50 packs/day    Types: Cigarettes  . Smokeless tobacco: Never Used  . Alcohol Use: 0.0 oz/week    0 Standard drinks or equivalent per week     Comment: ocassionally     Family History  Problem Relation Age of Onset  . Anesthesia problems Neg Hx   . Hypotension Neg Hx   . Malignant hyperthermia Neg Hx   . Pseudochol deficiency Neg Hx   . Cervical cancer Mother       Review of Systems  Constitutional: negative for fatigue and weight loss Respiratory: negative for cough and wheezing Cardiovascular: negative for chest pain, fatigue and palpitations Gastrointestinal: negative for abdominal pain and change in bowel habits Musculoskeletal:negative for myalgias Neurological: negative for gait problems and tremors Behavioral/Psych: negative for abusive relationship, depression Endocrine: negative for temperature intolerance   Genitourinary:negative for abnormal menstrual periods, genital lesions, hot flashes, sexual problems and vaginal discharge Integument/breast:  negative for breast lump, breast tenderness, nipple discharge and skin lesion(s)    Objective:       BP 108/75 mmHg  Pulse 74  Temp(Src) 99.1 F (37.3 C)  Wt 149 lb (67.586 kg)  LMP 04/13/2015 General:   alert  Skin:   no rash or abnormalities  Lungs:   clear to auscultation bilaterally  Heart:   regular rate and rhythm, S1, S2 normal, no murmur, click, rub or gallop  Breasts:   normal without suspicious masses, skin or nipple changes or axillary nodes  Abdomen:   normal findings: no organomegaly, soft, non-tender and no hernia  Pelvis:  External genitalia: normal general appearance Urinary system: urethral meatus normal and bladder without fullness, nontender Vaginal: normal without tenderness, induration or masses Cervix: normal appearance Adnexa: normal bimanual exam Uterus: anteverted and non-tender, normal size   Lab Review Urine pregnancy test Labs reviewed yes Radiologic studies reviewed no  50% of 30 min visit spent on counseling and coordination of care.   Assessment:    Healthy female exam.    Tobacco abuse  STD screening exam   Plan:    Education reviewed: calcium supplements, depression evaluation, low fat, low cholesterol diet, safe sex/STD prevention, self breast exams, skin cancer screening, smoking cessation and weight bearing exercise. Contraception: OCP (estrogen/progesterone). Follow up in: 1 year.  Hand written rx for brand name Seasonique given to patient with instructions.  No orders of the defined types were placed in this encounter.   Orders Placed This Encounter  Procedures  . SureSwab, Vaginosis/Vaginitis Plus  . HIV antibody (with reflex)  . Hepatitis B surface antigen  . RPR  . Hepatitis C antibody  . CBC with Differential/Platelet  . Comprehensive metabolic panel  . TSH

## 2015-06-28 LAB — RPR

## 2015-06-28 LAB — HEPATITIS C ANTIBODY: HCV AB: NEGATIVE

## 2015-06-28 LAB — HEPATITIS B SURFACE ANTIGEN: HEP B S AG: NEGATIVE

## 2015-06-28 LAB — HIV ANTIBODY (ROUTINE TESTING W REFLEX): HIV 1&2 Ab, 4th Generation: NONREACTIVE

## 2015-07-01 LAB — PAP IG W/ RFLX HPV ASCU

## 2015-07-02 LAB — SURESWAB, VAGINOSIS/VAGINITIS PLUS
ATOPOBIUM VAGINAE: NOT DETECTED Log (cells/mL)
C. PARAPSILOSIS, DNA: NOT DETECTED
C. TROPICALIS, DNA: NOT DETECTED
C. albicans, DNA: NOT DETECTED
C. glabrata, DNA: NOT DETECTED
C. trachomatis RNA, TMA: NOT DETECTED
Gardnerella vaginalis: NOT DETECTED Log (cells/mL)
LACTOBACILLUS SPECIES: 7.7 Log (cells/mL)
MEGASPHAERA SPECIES: NOT DETECTED Log (cells/mL)
N. GONORRHOEAE RNA, TMA: NOT DETECTED
T. vaginalis RNA, QL TMA: NOT DETECTED

## 2016-07-12 NOTE — L&D Delivery Note (Signed)
   Delivery Note Pt pushed for about 10 minutes, and the baby had persistent bradycardia in the 70-90's despite 02. Verbal consent: obtained from patient. Risks and benefits discussed  A Kiwi was applied and over one push, the baby crowned and the kiwi came off. At this time, delivery turned over to Dr. Freada Bergeron  At  a viable boy was delivered via  (Presentation: LOA  ). Nuchal cord delivered through.  APGAR: 8/9, ; weight  pending   After 1 minute, the cord was clamped and cut. 40 units of pitocin diluted in 1000cc LR was infused rapidly IV.  The placenta separated spontaneously and delivered via CCT and maternal pushing effort.  It was inspected and appears to be intact with a 3 VC.   Anesthesia:  local Episiotomy:  none Lacerations:  2nd Suture Repair: 2-0 Est. Blood Loss (mL):    Mom to postpartum.  Baby to Couplet care / Skin to Skin.  Alyssa Whitehead,Alyssa Whitehead 03/25/2017, 8:08 AM

## 2016-08-11 ENCOUNTER — Ambulatory Visit (INDEPENDENT_AMBULATORY_CARE_PROVIDER_SITE_OTHER): Payer: Medicaid Other

## 2016-08-11 DIAGNOSIS — Z3201 Encounter for pregnancy test, result positive: Secondary | ICD-10-CM | POA: Diagnosis not present

## 2016-08-11 DIAGNOSIS — N926 Irregular menstruation, unspecified: Secondary | ICD-10-CM | POA: Diagnosis not present

## 2016-08-11 LAB — POCT URINE PREGNANCY: Preg Test, Ur: POSITIVE — AB

## 2016-08-11 NOTE — Progress Notes (Signed)
Pt present for nurse visit for pregnancy test. UPT positive. Pt unknown LMP. Pt aware to start PNV's and schedule NOB visit.

## 2016-08-30 ENCOUNTER — Encounter: Payer: Medicaid Other | Admitting: Certified Nurse Midwife

## 2016-09-01 ENCOUNTER — Encounter: Payer: Self-pay | Admitting: *Deleted

## 2016-09-01 ENCOUNTER — Encounter: Payer: Self-pay | Admitting: Obstetrics and Gynecology

## 2016-09-01 ENCOUNTER — Other Ambulatory Visit (HOSPITAL_COMMUNITY)
Admission: RE | Admit: 2016-09-01 | Discharge: 2016-09-01 | Disposition: A | Discharge status: Home / Self Care | Payer: Medicaid Other | Source: Ambulatory Visit | Attending: Obstetrics and Gynecology | Admitting: Obstetrics and Gynecology

## 2016-09-01 ENCOUNTER — Ambulatory Visit (INDEPENDENT_AMBULATORY_CARE_PROVIDER_SITE_OTHER): Payer: Medicaid Other | Admitting: Obstetrics and Gynecology

## 2016-09-01 ENCOUNTER — Encounter: Payer: Medicaid Other | Admitting: Obstetrics and Gynecology

## 2016-09-01 VITALS — BP 115/73 | HR 93 | Wt 154.0 lb

## 2016-09-01 DIAGNOSIS — Z113 Encounter for screening for infections with a predominantly sexual mode of transmission: Secondary | ICD-10-CM | POA: Diagnosis present

## 2016-09-01 DIAGNOSIS — Z3481 Encounter for supervision of other normal pregnancy, first trimester: Secondary | ICD-10-CM

## 2016-09-01 DIAGNOSIS — Z3491 Encounter for supervision of normal pregnancy, unspecified, first trimester: Secondary | ICD-10-CM

## 2016-09-01 DIAGNOSIS — Z3490 Encounter for supervision of normal pregnancy, unspecified, unspecified trimester: Secondary | ICD-10-CM | POA: Insufficient documentation

## 2016-09-01 NOTE — Progress Notes (Signed)
New OB Note  09/01/2016   Clinic: [facility]  Chief Complaint: NOB  Transfer of Care Patient: no  History of Present Illness: Alyssa Whitehead is a 26 y.o. G2P1001 @ 11/0 weeks (EDC 9/12, based on 11wk u/s today) Patient's last menstrual period was 05/12/2016 (lmp unknown).).  Preg complicated by has Other seasonal allergic rhinitis; BV (bacterial vaginosis); and Supervision of normal pregnancy in first trimester on her problem list.   Her periods were: irregular She was using no method when she conceived.  She has Negative signs or symptoms of nausea/vomiting of pregnancy. She has Negative signs or symptoms of miscarriage or preterm labor On any different medications around the time she conceived/early pregnancy: No  ROS: A 12-point review of systems was performed and negative, except as stated in the above HPI.  OBGYN History: As per HPI. OB History  Gravida Para Term Preterm AB Living  2 1 1     1   SAB TAB Ectopic Multiple Live Births          1    # Outcome Date GA Lbr Len/2nd Weight Sex Delivery Anes PTL Lv  2 Current           1 Term 05/12/11 [redacted]w[redacted]d 26:31 / 02:12 7 lb (3.175 kg) F Vag-Spont EPI  LIV      Any issues with any prior pregnancies: no Prior children are healthy, doing well, and without any problems or issues: yes History of pap smears: Yes. Last pap smear 2016 and results were negative History of STIs: No   Past Medical History: Past Medical History:  Diagnosis Date  . Radius distal fracture LT   FELL OFF HORSE 09-18-11  LT    Past Surgical History: Past Surgical History:  Procedure Laterality Date  . WRIST FRACTURE SURGERY  2013    Family History:  Family History  Problem Relation Age of Onset  . Cervical cancer Mother   . Cervical cancer Maternal Grandmother   . Cancer Maternal Grandfather 73    bladder and lung  . Anesthesia problems Neg Hx   . Hypotension Neg Hx   . Malignant hyperthermia Neg Hx   . Pseudochol deficiency Neg  Hx    She denies any female cancers, bleeding or blood clotting disorders.  She denies any history of mental retardation, birth defects or genetic disorders in her or the FOB's history  Social History:  Social History   Social History  . Marital status: Single    Spouse name: N/A  . Number of children: N/A  . Years of education: N/A   Occupational History  . Not on file.   Social History Main Topics  . Smoking status: Former Smoker    Packs/day: 0.00    Types: Cigarettes    Quit date: 07/01/2016  . Smokeless tobacco: Never Used  . Alcohol use 0.0 oz/week     Comment: ocassionally   . Drug use: No  . Sexual activity: Yes    Birth control/ protection: None   Other Topics Concern  . Not on file   Social History Narrative  . 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 Fundal height: not applicable FHTs: 160s  General appearance: Well nourished, well developed female in no acute distress.  Neck:  Supple, normal appearance, and no thyromegaly  Cardiovascular: S1, S2 normal, no murmur, rub or gallop, regular rate and rhythm Respiratory:  Clear to auscultation bilateral. Normal respiratory effort Abdomen: positive bowel sounds and no masses, hernias; diffusely non tender to palpation, non distended Breasts: breasts appear normal, no suspicious masses, no skin or nipple changes or axillary nodes, and normal palpation. Neuro/Psych:  Normal mood and affect.  Skin:  Warm and dry.   Laboratory: none  Imaging:  Bedside u/s shows SLIUP with normal FHR and subjectively normal fluid, great FM. Hard to get great CRL but appears 11wks  Assessment: pt doing well  Plan: 1. Encounter for  supervision of normal pregnancy in first trimester, unspecified gravidity Routine care. Pt okay for baby scripts. Offer afp nv - US MFM Fetal Nuchal Translucency; Future - Cystic Fibrosis Mutation 97 - SMN1 COPY NUMBER ANALYSIS (SMA Carrier Screen) - Hemoglobinopathy Evaluation - Urine Culture - GC/Chlamydia Probe Amp - Obstetric Panel, Including HIV - US MFM OB COMP + 14 WK; Future   Problem list reviewed and updated.  Follow up per baby scripts schedule  >50% of 25 min visit spent on counseling and coordination of care.     Cornelia Copaharlie Nohlan Burdin, Jr. MD Attending [facility](Faculty Practice)

## 2016-09-02 LAB — URINE CYTOLOGY ANCILLARY ONLY
Chlamydia: NEGATIVE
Neisseria Gonorrhea: NEGATIVE

## 2016-09-03 LAB — URINE CULTURE: Organism ID, Bacteria: NO GROWTH

## 2016-09-06 ENCOUNTER — Encounter: Payer: Self-pay | Admitting: *Deleted

## 2016-09-10 ENCOUNTER — Encounter (HOSPITAL_COMMUNITY): Payer: Self-pay

## 2016-09-10 ENCOUNTER — Ambulatory Visit (HOSPITAL_COMMUNITY)
Admission: RE | Admit: 2016-09-10 | Discharge: 2016-09-10 | Disposition: A | Discharge status: Home / Self Care | Payer: Medicaid Other | Source: Ambulatory Visit | Attending: Obstetrics and Gynecology | Admitting: Obstetrics and Gynecology

## 2016-09-10 ENCOUNTER — Other Ambulatory Visit: Payer: Self-pay | Admitting: Obstetrics and Gynecology

## 2016-09-10 DIAGNOSIS — Z3491 Encounter for supervision of normal pregnancy, unspecified, first trimester: Secondary | ICD-10-CM

## 2016-09-10 DIAGNOSIS — Z3682 Encounter for antenatal screening for nuchal translucency: Secondary | ICD-10-CM

## 2016-09-10 DIAGNOSIS — Z3A12 12 weeks gestation of pregnancy: Secondary | ICD-10-CM | POA: Diagnosis not present

## 2016-09-13 ENCOUNTER — Encounter: Payer: Self-pay | Admitting: Obstetrics and Gynecology

## 2016-09-13 DIAGNOSIS — Z3481 Encounter for supervision of other normal pregnancy, first trimester: Secondary | ICD-10-CM

## 2016-09-13 LAB — OBSTETRIC PANEL, INCLUDING HIV
Antibody Screen: NEGATIVE
BASOS ABS: 0 10*3/uL (ref 0.0–0.2)
Basos: 0 %
EOS (ABSOLUTE): 0.2 10*3/uL (ref 0.0–0.4)
Eos: 2 %
HEP B S AG: NEGATIVE
HIV Screen 4th Generation wRfx: NONREACTIVE
Hematocrit: 38.2 % (ref 34.0–46.6)
Hemoglobin: 13.1 g/dL (ref 11.1–15.9)
IMMATURE GRANULOCYTES: 0 %
Immature Grans (Abs): 0 10*3/uL (ref 0.0–0.1)
LYMPHS ABS: 2.2 10*3/uL (ref 0.7–3.1)
Lymphs: 20 %
MCH: 30.4 pg (ref 26.6–33.0)
MCHC: 34.3 g/dL (ref 31.5–35.7)
MCV: 89 fL (ref 79–97)
MONOCYTES: 8 %
Monocytes Absolute: 0.8 10*3/uL (ref 0.1–0.9)
NEUTROS PCT: 70 %
Neutrophils Absolute: 7.5 10*3/uL — ABNORMAL HIGH (ref 1.4–7.0)
PLATELETS: 237 10*3/uL (ref 150–379)
RBC: 4.31 x10E6/uL (ref 3.77–5.28)
RDW: 13.7 % (ref 12.3–15.4)
RPR: NONREACTIVE
Rh Factor: POSITIVE
Rubella Antibodies, IGG: 1.62 index (ref >0.99)
WBC: 10.7 10*3/uL (ref 3.4–10.8)

## 2016-09-13 LAB — SMN1 COPY NUMBER ANALYSIS (SMA CARRIER SCREENING)

## 2016-09-13 LAB — HEMOGLOBINOPATHY EVALUATION
Ferritin: 69 ng/mL (ref 15–150)
HGB C: 0 %
HGB S: 0 %
HGB SOLUBILITY: NEGATIVE
HGB VARIANT: 0 %
Hgb A2 Quant: 2.7 % (ref 1.8–3.2)
Hgb A: 97.3 % (ref 96.4–98.8)
Hgb F Quant: 0 % (ref 0.0–2.0)

## 2016-09-13 LAB — CYSTIC FIBROSIS MUTATION 97: Interpretation: NOT DETECTED

## 2016-09-15 ENCOUNTER — Other Ambulatory Visit: Payer: Self-pay

## 2016-09-21 ENCOUNTER — Encounter: Payer: Self-pay | Admitting: *Deleted

## 2016-10-20 ENCOUNTER — Other Ambulatory Visit: Payer: Self-pay | Admitting: Obstetrics and Gynecology

## 2016-10-20 ENCOUNTER — Ambulatory Visit (HOSPITAL_COMMUNITY)
Admission: RE | Admit: 2016-10-20 | Discharge: 2016-10-20 | Disposition: A | Discharge status: Home / Self Care | Payer: Medicaid Other | Source: Ambulatory Visit | Attending: Obstetrics and Gynecology | Admitting: Obstetrics and Gynecology

## 2016-10-20 DIAGNOSIS — Z3689 Encounter for other specified antenatal screening: Secondary | ICD-10-CM

## 2016-10-20 DIAGNOSIS — O359XX0 Maternal care for (suspected) fetal abnormality and damage, unspecified, not applicable or unspecified: Secondary | ICD-10-CM

## 2016-10-20 DIAGNOSIS — Z3A18 18 weeks gestation of pregnancy: Secondary | ICD-10-CM

## 2016-10-20 DIAGNOSIS — Z3491 Encounter for supervision of normal pregnancy, unspecified, first trimester: Secondary | ICD-10-CM

## 2016-11-03 ENCOUNTER — Encounter: Payer: Self-pay | Admitting: Obstetrics & Gynecology

## 2016-11-03 ENCOUNTER — Ambulatory Visit (INDEPENDENT_AMBULATORY_CARE_PROVIDER_SITE_OTHER): Payer: Medicaid Other | Admitting: Obstetrics & Gynecology

## 2016-11-03 VITALS — BP 108/68 | HR 107 | Wt 159.0 lb

## 2016-11-03 DIAGNOSIS — O350XX0 Maternal care for (suspected) central nervous system malformation in fetus, not applicable or unspecified: Secondary | ICD-10-CM

## 2016-11-03 DIAGNOSIS — IMO0002 Reserved for concepts with insufficient information to code with codable children: Secondary | ICD-10-CM | POA: Insufficient documentation

## 2016-11-03 DIAGNOSIS — Z3480 Encounter for supervision of other normal pregnancy, unspecified trimester: Secondary | ICD-10-CM

## 2016-11-03 DIAGNOSIS — Z3482 Encounter for supervision of other normal pregnancy, second trimester: Secondary | ICD-10-CM

## 2016-11-03 NOTE — Progress Notes (Signed)
PRENATAL VISIT NOTE  Subjective:  Alyssa Whitehead is a 26 y.o. G2P1001 at [redacted]w[redacted]d being seen today for ongoing prenatal care.  She is currently monitored for the following issues for this low-risk pregnancy and has Supervision of normal pregnancy on her problem list.  Patient reports no complaints.  Contractions: Not present. Vag. Bleeding: None.  Movement: Present. Denies leaking of fluid.   The following portions of the patient's history were reviewed and updated as appropriate: allergies, current medications, past family history, past medical history, past social history, past surgical history and problem list. Problem list updated.  Objective:   Vitals:   11/03/16 0919  BP: 108/68  Pulse: (!) 107  Weight: 159 lb (72.1 kg)    Fetal Status: Fetal Heart Rate (bpm): 140 Fundal Height: 20 cm Movement: Present     General:  Alert, oriented and cooperative. Patient is in no acute distress.  Skin: Skin is warm and dry. No rash noted.   Cardiovascular: Normal heart rate noted  Respiratory: Normal respiratory effort, no problems with respiration noted  Abdomen: Soft, gravid, appropriate for gestational age. Pain/Pressure: Absent     Pelvic:  Cervical exam deferred        Extremities: Normal range of motion.  Edema: None  Mental Status: Normal mood and affect. Normal behavior. Normal judgment and thought content.   [place] Mfm Ob Detail +14 Wk  Result Date: 10/20/2016 ----------------------------------------------------------------------  OBSTETRICS REPORT                      (Signed Final 10/20/2016 02:50 pm) ---------------------------------------------------------------------- Patient Info  ID #:       [id number]                         D.O.B.:   11-Dec-1990 (26 yrs)  Name:       Alyssa Whitehead                  Visit Date:  10/20/2016 01:43 pm ---------------------------------------------------------------------- Performed By  Performed By:     Eden Lathe BS      Ref. Address:     [address]                    [facility]                                                             [address]                                                             [place], [place]                                                             [address]  Attending:        Clarene Critchley [facility]  Location:         [facility]                    MD  Referred By:      [place] Bing MD ---------------------------------------------------------------------- Orders   #  Description                                 Code   1  [place] MFM OB DETAIL +14 WK                     76811.01  ----------------------------------------------------------------------   #  Ordered By               Order #        Accession #    Episode #   1  Alpha Gula            [id number]      [id number]     [id number]  ---------------------------------------------------------------------- Indications   [redacted] weeks gestation of pregnancy                Z3A.18   Encounter for fetal anatomic survey            Z36.89   Fetal abnormality - other known or             O35.9XX0   suspected (bilateral CPCs)  ---------------------------------------------------------------------- OB History  Gravidity:    2         Term:   1        Prem:   0        SAB:   0  TOP:          0       Ectopic:  0        Living: 1 ---------------------------------------------------------------------- Fetal Evaluation  Num Of Fetuses:     1  Fetal Heart         138  Rate(bpm):  Cardiac Activity:   Observed  Presentation:       Breech  Placenta:           Anterior, above cervical os  P. Cord Insertion:  Visualized, central  Amniotic Fluid  AFI FV:      Subjectively within normal limits                              Largest Pocket(cm)                              5.36 ---------------------------------------------------------------------- Biometry  BPD:        42  mm     G. Age:  18w 5d         64  %    CI:        71.47   %   70 - 86                                                           FL/HC:  17.2   %   15.8 - 18  HC:      158.2  mm     G. Age:  18w 5d         56  %    HC/AC:      1.12       1.07 - 1.29  AC:      140.8  mm     G. Age:  19w 3d         79  %    FL/BPD:     64.8   %  FL:       27.2  mm     G. Age:  18w 2d         40  %    FL/AC:      19.3   %   20 - 24  HUM:      26.7  mm     G. Age:  18w 3d         55  %  CER:      19.7  mm     G. Age:  18w 6d         65  %  NFT:       3.7  mm  CM:        5.3  mm  Est. FW:     263  gm      0 lb 9 oz     54  % ---------------------------------------------------------------------- Gestational Age  LMP:           23w 0d       Date:   05/12/16                 EDD:   02/16/17  U/S Today:     18w 6d                                        EDD:   03/17/17  Best:          18w 3d    Det. By:   Melida Quitter 1st  (09/10/16)    EDD:   03/20/17 ---------------------------------------------------------------------- Anatomy  Cranium:               Appears normal         Aortic Arch:            Appears normal  Cavum:                 Appears normal         Ductal Arch:            Appears normal  Ventricles:            Appears normal         Diaphragm:              Appears normal  Choroid Plexus:        Bilateral choroid      Stomach:                Appears normal, left                         plexus cysts  sided  Cerebellum:            Appears normal         Abdomen:                Appears normal  Posterior Fossa:       Appears normal         Abdominal Wall:         Appears nml (cord                                                                        insert, abd wall)  Nuchal Fold:           Appears normal         Cord Vessels:           Appears normal (3                                                                        vessel cord)  Face:                  Appears normal         Kidneys:                Appear normal                         (orbits and profile)  Lips:                   Appears normal         Bladder:                Appears normal  Thoracic:              Appears normal         Spine:                  Appears normal  Heart:                 Appears normal         Upper Extremities:      Appears normal                         (4CH, axis, and                         situs)  RVOT:                  Appears normal         Lower Extremities:      Appears normal  LVOT:                  Appears normal  Other:  Fetus appears to be a female. Heels visualized. Left 5th digit visualized. ---------------------------------------------------------------------- Cervix Uterus Adnexa  Cervix  Length:  3.2  cm.  Normal appearance by transabdominal scan.  Uterus  No abnormality visualized.  Left Ovary  Not visualized.  Right Ovary  Within normal limits.  Cul De Sac:   No free fluid seen.  Adnexa:       No abnormality visualized. ---------------------------------------------------------------------- Comments  Choroid plexus cysts were noted.  This is a common variant  (1-2%) of all pregnancies, and carries a small association  with Trisomy 18.  However, there were no other ultrasound  stigmata of Trisomy 18, making the possibility of Trisomy 18  less likely. ---------------------------------------------------------------------- Impression  Single IUP at 18w 3d  Bilateral choroid plexus cysts were noted (see comments  above)  The remainder of the fetal anatomy appears normal  Ultrasound measurements are consistent with early  ultrasound  Anterior placenta without previa  Normal amniotic fluid volume ---------------------------------------------------------------------- Recommendations  Would offer quad screen if not yet performed  Follow-up ultrasounds as clinically indicated. ----------------------------------------------------------------------                Candis Shine, MD Electronically Signed Final Report   10/20/2016 02:50 pm  ----------------------------------------------------------------------   Assessment and Plan:  Pregnancy: G2P1001 at [redacted]w[redacted]d  1. Choroid plexus cyst of fetus on prenatal ultrasound Isolated finding, patient reassured. Had normal first trimester screen, AFP today. Patient desires follow up scan. - [place] MFM OB FOLLOW UP; Future - AFP, Serum, Open Spina Bifida  2. Supervision of other normal pregnancy, antepartum Preterm labor symptoms and general obstetric precautions including but not limited to vaginal bleeding, contractions, leaking of fluid and fetal movement were reviewed in detail with the patient. Please refer to After Visit Summary for other counseling recommendations.  Return in about 4 weeks (around 12/01/2016) for OB Visit.   Tereso Newcomer, MD

## 2016-11-03 NOTE — Patient Instructions (Signed)

## 2016-11-06 LAB — AFP, SERUM, OPEN SPINA BIFIDA
AFP MoM: 0.77
AFP VALUE AFPOSL: 43.9 ng/mL
GEST. AGE ON COLLECTION DATE: 20.4 wk
Maternal Age At EDD: 26.5 yr
OSBR RISK 1 IN: 10000
Test Results:: NEGATIVE
WEIGHT: 159 [lb_av]

## 2016-11-23 ENCOUNTER — Inpatient Hospital Stay (HOSPITAL_COMMUNITY)
Admission: AD | Admit: 2016-11-23 | Discharge: 2016-11-23 | Disposition: A | Discharge status: Home / Self Care | Payer: Medicaid Other | Source: Ambulatory Visit | Attending: Obstetrics and Gynecology | Admitting: Obstetrics and Gynecology

## 2016-11-23 ENCOUNTER — Encounter (HOSPITAL_COMMUNITY): Payer: Self-pay | Admitting: *Deleted

## 2016-11-23 DIAGNOSIS — Z8049 Family history of malignant neoplasm of other genital organs: Secondary | ICD-10-CM | POA: Insufficient documentation

## 2016-11-23 DIAGNOSIS — N898 Other specified noninflammatory disorders of vagina: Secondary | ICD-10-CM | POA: Diagnosis present

## 2016-11-23 DIAGNOSIS — Z3A23 23 weeks gestation of pregnancy: Secondary | ICD-10-CM | POA: Diagnosis not present

## 2016-11-23 DIAGNOSIS — Z87891 Personal history of nicotine dependence: Secondary | ICD-10-CM | POA: Diagnosis not present

## 2016-11-23 DIAGNOSIS — O26892 Other specified pregnancy related conditions, second trimester: Secondary | ICD-10-CM | POA: Insufficient documentation

## 2016-11-23 LAB — WET PREP, GENITAL
CLUE CELLS WET PREP: NONE SEEN
Sperm: NONE SEEN
Trich, Wet Prep: NONE SEEN
Yeast Wet Prep HPF POC: NONE SEEN

## 2016-11-23 LAB — URINALYSIS, ROUTINE W REFLEX MICROSCOPIC
Bilirubin Urine: NEGATIVE
Glucose, UA: NEGATIVE mg/dL
HGB URINE DIPSTICK: NEGATIVE
KETONES UR: 5 mg/dL — AB
NITRITE: NEGATIVE
PROTEIN: NEGATIVE mg/dL
Specific Gravity, Urine: 1.025 (ref 1.005–1.030)
pH: 6 (ref 5.0–8.0)

## 2016-11-23 LAB — POCT FERN TEST: POCT FERN TEST: NEGATIVE

## 2016-11-23 NOTE — MAU Provider Note (Signed)
Chief Complaint:  Rupture of Membranes   First Provider Initiated Contact with Patient 11/23/16 1945     HPI: Alyssa Whitehead is a 26 y.o. G2P1001 at 4423w2dwho presents to maternity admissions reporting episode of slight wetness on underwear.  . She reports good fetal movement, denies LOF, vaginal bleeding, vaginal itching/burning, urinary symptoms, h/a, dizziness, n/v, diarrhea, constipation or fever/chills.  She denies headache, visual changes or RUQ abdominal pain.  Vaginal Discharge  The patient's primary symptoms include vaginal discharge. The patient's pertinent negatives include no genital itching, genital lesions, genital odor, pelvic pain or vaginal bleeding. This is a new problem. The current episode started today. The problem occurs rarely. The problem has been resolved. The patient is experiencing no pain. She is pregnant. Pertinent negatives include no abdominal pain, constipation, diarrhea, fever, frequency, headaches, hematuria, nausea or vomiting. The vaginal discharge was clear and watery. There has been no bleeding. She has not been passing clots. She has not been passing tissue. Nothing aggravates the symptoms. She has tried nothing for the symptoms.    RN Note: PT SAYS  NOTICED WETNESS  IN HER UNDERWEAR  SINCE LAST WED-  UNSURE IF  ITS SWEAT.    TODAY   NOTICED 2 DROPS AFTER SHOWERED.     SHE CALLED   NURSE LINE- TOLD  TO COME IN.         [facility] - WITH [facility].      ALL OK WITH PREG.          LAST SEX-     LAST Friday.   Past Medical History: Past Medical History:  Diagnosis Date  . Other seasonal allergic rhinitis 04/02/2014  . Radius distal fracture LT   FELL OFF HORSE 09-18-11  LT    Past obstetric history: OB History  Gravida Para Term Preterm AB Living  2 1 1     1   SAB TAB Ectopic Multiple Live Births          1    # Outcome Date GA Lbr Len/2nd Weight Sex Delivery Anes PTL Lv  2 Current           1 Term 05/12/11 6970w4d 26:31 / 02:12 7 lb (3.175 kg) F Vag-Spont EPI   LIV      Past Surgical History: Past Surgical History:  Procedure Laterality Date  . WRIST FRACTURE SURGERY  2013    Family History: Family History  Problem Relation Age of Onset  . Cervical cancer Mother   . Cervical cancer Maternal Grandmother   . Cancer Maternal Grandfather 73       bladder and lung  . Anesthesia problems Neg Hx   . Hypotension Neg Hx   . Malignant hyperthermia Neg Hx   . Pseudochol deficiency Neg Hx     Social History: Social History  Substance Use Topics  . Smoking status: Former Smoker    Packs/day: 0.00    Types: Cigarettes    Quit date: 07/01/2016  . Smokeless tobacco: Never Used  . Alcohol use No     Comment: ocassionally     Allergies: No Known Allergies  Meds:  Prescriptions Prior to Admission  Medication Sig Dispense Refill Last Dose  . Prenatal Vit-Fe Fumarate-FA (PRENATAL VITAMIN PO) Take 1 tablet by mouth daily.   Taking    I have reviewed patient's Past Medical Hx, Surgical Hx, Family Hx, Social Hx, medications and allergies.   ROS:  Review of Systems  Constitutional: Negative for fever.  Gastrointestinal: Negative  for abdominal pain, constipation, diarrhea, nausea and vomiting.  Genitourinary: Positive for vaginal discharge. Negative for frequency, hematuria and pelvic pain.  Neurological: Negative for headaches.   Other systems negative  Physical Exam  Patient Vitals for the past 24 hrs:  BP Temp Temp src Pulse Resp Height Weight  11/23/16 1933 102/62 98.3 F (36.8 C) Oral 98 20 5\' 3"  (1.6 m) 163 lb 8 oz (74.2 kg)   Constitutional: Well-developed, well-nourished female in no acute distress.  Cardiovascular: normal rate and rhythm Respiratory: normal effort, clear to auscultation bilaterally GI: Abd soft, non-tender, gravid appropriate for gestational age.   No rebound or guarding. MS: Extremities nontender, no edema, normal ROM Neurologic: Alert and oriented x 4.  GU: Neg CVAT.  PELVIC EXAM: Cervix pink, visually  closed, without lesion, scant white creamy discharge, vaginal walls and external genitalia normal Bimanual exam: Cervix firm, posterior, neg CMT, uterus nontender, Fundal Height consistent with dates, adnexa without tenderness, enlargement, or mass    Dilation: Closed Effacement (%): Thick Cervical Position: Posterior Exam by:: M.Tamaj Jurgens,CNM   FHT:  Baseline 140 , moderate variability, accelerations present, no decelerations Contractions:  Rare   Labs: Results for orders placed or performed during the hospital encounter of 11/23/16 (from the past 24 hour(s))  Urinalysis, Routine w reflex microscopic     Status: Abnormal   Collection Time: 11/23/16  7:36 PM  Result Value Ref Range   Color, Urine YELLOW YELLOW   APPearance HAZY (A) CLEAR   Specific Gravity, Urine 1.025 1.005 - 1.030   pH 6.0 5.0 - 8.0   Glucose, UA NEGATIVE NEGATIVE mg/dL   Hgb urine dipstick NEGATIVE NEGATIVE   Bilirubin Urine NEGATIVE NEGATIVE   Ketones, ur 5 (A) NEGATIVE mg/dL   Protein, ur NEGATIVE NEGATIVE mg/dL   Nitrite NEGATIVE NEGATIVE   Leukocytes, UA TRACE (A) NEGATIVE   RBC / HPF 0-5 0 - 5 RBC/hpf   WBC, UA 0-5 0 - 5 WBC/hpf   Bacteria, UA RARE (A) NONE SEEN   Squamous Epithelial / LPF 0-5 (A) NONE SEEN   Mucous PRESENT   Wet prep, genital     Status: Abnormal   Collection Time: 11/23/16  7:50 PM  Result Value Ref Range   Yeast Wet Prep HPF POC NONE SEEN NONE SEEN   Trich, Wet Prep NONE SEEN NONE SEEN   Clue Cells Wet Prep HPF POC NONE SEEN NONE SEEN   WBC, Wet Prep HPF POC MODERATE (A) NONE SEEN   Sperm NONE SEEN   Fern Test     Status: Normal   Collection Time: 11/23/16  8:22 PM  Result Value Ref Range   POCT Fern Test Negative = intact amniotic membranes     Imaging:  No results found.  MAU Course/MDM: I have ordered labs and reviewed results.  NST reviewed and found to be reactive, average variability with no decels and pos accels, no contractions Negative exam for ruptured  membranes  Assessment: Single IUP at [redacted]w[redacted]d Vaginal discharge during pregnancy in second trimester - Plan: Discharge patient No evidence of ruptured membranes  Plan: Discharge home Preterm  Labor precautions and fetal kick counts Follow up in Office for prenatal visits and recheck of status   Pt stable at time of discharge.  Wynelle Bourgeois CNM, MSN Certified Nurse-Midwife 11/23/2016 7:45 PM

## 2016-11-23 NOTE — Discharge Instructions (Signed)

## 2016-11-23 NOTE — MAU Note (Signed)
PT SAYS  NOTICED WETNESS  IN HER UNDERWEAR  SINCE LAST WED-  UNSURE IF  ITS SWEAT.    TODAY   NOTICED 2 DROPS AFTER SHOWERED.     SHE CALLED   NURSE LINE- TOLD  TO COME IN.         [facility] - [facility].      ALL OK WITH PREG.          LAST SEX-     LAST Friday.

## 2016-12-15 ENCOUNTER — Ambulatory Visit (HOSPITAL_COMMUNITY)
Admission: RE | Admit: 2016-12-15 | Discharge: 2016-12-15 | Disposition: A | Discharge status: Home / Self Care | Payer: Medicaid Other | Source: Ambulatory Visit | Attending: Obstetrics & Gynecology | Admitting: Obstetrics & Gynecology

## 2016-12-15 ENCOUNTER — Other Ambulatory Visit: Payer: Self-pay | Admitting: Obstetrics & Gynecology

## 2016-12-15 DIAGNOSIS — Z3A26 26 weeks gestation of pregnancy: Secondary | ICD-10-CM

## 2016-12-15 DIAGNOSIS — O350XX0 Maternal care for (suspected) central nervous system malformation in fetus, not applicable or unspecified: Secondary | ICD-10-CM

## 2016-12-15 DIAGNOSIS — IMO0002 Reserved for concepts with insufficient information to code with codable children: Secondary | ICD-10-CM

## 2016-12-15 NOTE — Addendum Note (Signed)
Encounter addended by: Drue NovelKeatts, Genesi Stefanko J, RDMS on: 12/15/2016  2:18 PM<BR>    Actions taken: Imaging Exam ended

## 2016-12-30 ENCOUNTER — Encounter: Payer: Self-pay | Admitting: *Deleted

## 2016-12-30 ENCOUNTER — Ambulatory Visit (INDEPENDENT_AMBULATORY_CARE_PROVIDER_SITE_OTHER): Payer: Medicaid Other | Admitting: Obstetrics & Gynecology

## 2016-12-30 VITALS — BP 99/65 | HR 104 | Wt 169.0 lb

## 2016-12-30 DIAGNOSIS — Z3483 Encounter for supervision of other normal pregnancy, third trimester: Secondary | ICD-10-CM

## 2016-12-30 DIAGNOSIS — Z3482 Encounter for supervision of other normal pregnancy, second trimester: Secondary | ICD-10-CM

## 2016-12-30 NOTE — Progress Notes (Signed)
   PRENATAL VISIT NOTE  Subjective:  Alyssa Whitehead is a 26 y.o. G2P1001 at 2963w4d being seen today for ongoing prenatal care.  She is currently monitored for the following issues for this low-risk pregnancy and has Supervision of normal pregnancy on her problem list.  Patient reports no complaints.  Contractions: Not present. Vag. Bleeding: None.  Movement: Present. Denies leaking of fluid.   The following portions of the patient's history were reviewed and updated as appropriate: allergies, current medications, past family history, past medical history, past social history, past surgical history and problem list. Problem list updated.  Objective:   Vitals:   12/30/16 0849  BP: 99/65  Pulse: (!) 104  Weight: 169 lb (76.7 kg)    Fetal Status: Fetal Heart Rate (bpm): 154 Fundal Height: 28 cm Movement: Present     General:  Alert, oriented and cooperative. Patient is in no acute distress.  Skin: Skin is warm and dry. No rash noted.   Cardiovascular: Normal heart rate noted  Respiratory: Normal respiratory effort, no problems with respiration noted  Abdomen: Soft, gravid, appropriate for gestational age. Pain/Pressure: Absent     Pelvic:  Cervical exam deferred        Extremities: Normal range of motion.  Edema: None  Mental Status: Normal mood and affect. Normal behavior. Normal judgment and thought content.   Assessment and Plan:  Pregnancy: G2P1001 at 1363w4d  1. Encounter for supervision of other normal pregnancy in third trimester Third trimester labs and Tdap today. - Glucose Tolerance, 2 Hours w/1 Hour - RPR - CBC - HIV antibody - Tdap vaccine greater than or equal to 7yo IM Preterm labor symptoms and general obstetric precautions including but not limited to vaginal bleeding, contractions, leaking of fluid and fetal movement were reviewed in detail with the patient. Please refer to After Visit Summary for other counseling recommendations.  Return in about 2 weeks (around  01/13/2017) for OB Visit.   Jaynie CollinsUgonna Anyanwu, MD

## 2016-12-30 NOTE — Patient Instructions (Signed)
Third Trimester of Pregnancy The third trimester is from week 28 through week 40 (months 7 through 9). The third trimester is a time when the unborn baby (fetus) is growing rapidly. At the end of the ninth month, the fetus is about 20 inches in length and weighs 6-10 pounds. Body changes during your third trimester Your body will continue to go through many changes during pregnancy. The changes vary from woman to woman. During the third trimester:  Your weight will continue to increase. You can expect to gain 25-35 pounds (11-16 kg) by the end of the pregnancy.  You may begin to get stretch marks on your hips, abdomen, and breasts.  You may urinate more often because the fetus is moving lower into your pelvis and pressing on your bladder.  You may develop or continue to have heartburn. This is caused by increased hormones that slow down muscles in the digestive tract.  You may develop or continue to have constipation because increased hormones slow digestion and cause the muscles that push waste through your intestines to relax.  You may develop hemorrhoids. These are swollen veins (varicose veins) in the rectum that can itch or be painful.  You may develop swollen, bulging veins (varicose veins) in your legs.  You may have increased body aches in the pelvis, back, or thighs. This is due to weight gain and increased hormones that are relaxing your joints.  You may have changes in your hair. These can include thickening of your hair, rapid growth, and changes in texture. Some women also have hair loss during or after pregnancy, or hair that feels dry or thin. Your hair will most likely return to normal after your baby is born.  Your breasts will continue to grow and they will continue to become tender. A yellow fluid (colostrum) may leak from your breasts. This is the first milk you are producing for your baby.  Your belly button may stick out.  You may notice more swelling in your hands,  face, or ankles.  You may have increased tingling or numbness in your hands, arms, and legs. The skin on your belly may also feel numb.  You may feel short of breath because of your expanding uterus.  You may have more problems sleeping. This can be caused by the size of your belly, increased need to urinate, and an increase in your body's metabolism.  You may notice the fetus "dropping," or moving lower in your abdomen (lightening).  You may have increased vaginal discharge.  You may notice your joints feel loose and you may have pain around your pelvic bone.  What to expect at prenatal visits You will have prenatal exams every 2 weeks until week 36. Then you will have weekly prenatal exams. During a routine prenatal visit:  You will be weighed to make sure you and the baby are growing normally.  Your blood pressure will be taken.  Your abdomen will be measured to track your baby's growth.  The fetal heartbeat will be listened to.  Any test results from the previous visit will be discussed.  You may have a cervical check near your due date to see if your cervix has softened or thinned (effaced).  You will be tested for Group B streptococcus. This happens between 35 and 37 weeks.  Your health care provider may ask you:  What your birth plan is.  How you are feeling.  If you are feeling the baby move.  If you have had   any abnormal symptoms, such as leaking fluid, bleeding, severe headaches, or abdominal cramping.  If you are using any tobacco products, including cigarettes, chewing tobacco, and electronic cigarettes.  If you have any questions.  Other tests or screenings that may be performed during your third trimester include:  Blood tests that check for low iron levels (anemia).  Fetal testing to check the health, activity level, and growth of the fetus. Testing is done if you have certain medical conditions or if there are problems during the  pregnancy.  Nonstress test (NST). This test checks the health of your baby to make sure there are no signs of problems, such as the baby not getting enough oxygen. During this test, a belt is placed around your belly. The baby is made to move, and its heart rate is monitored during movement.  What is false labor? False labor is a condition in which you feel small, irregular tightenings of the muscles in the womb (contractions) that usually go away with rest, changing position, or drinking water. These are called Braxton Hicks contractions. Contractions may last for hours, days, or even weeks before true labor sets in. If contractions come at regular intervals, become more frequent, increase in intensity, or become painful, you should see your health care provider. What are the signs of labor?  Abdominal cramps.  Regular contractions that start at 10 minutes apart and become stronger and more frequent with time.  Contractions that start on the top of the uterus and spread down to the lower abdomen and back.  Increased pelvic pressure and dull back pain.  A watery or bloody mucus discharge that comes from the vagina.  Leaking of amniotic fluid. This is also known as your "water breaking." It could be a slow trickle or a gush. Let your health care provider know if it has a color or strange odor. If you have any of these signs, call your health care provider right away, even if it is before your due date. Follow these instructions at home: Medicines  Follow your health care provider's instructions regarding medicine use. Specific medicines may be either safe or unsafe to take during pregnancy.  Take a prenatal vitamin that contains at least 600 micrograms (mcg) of folic acid.  If you develop constipation, try taking a stool softener if your health care provider approves. Eating and drinking  Eat a balanced diet that includes fresh fruits and vegetables, whole grains, good sources of protein  such as meat, eggs, or tofu, and low-fat dairy. Your health care provider will help you determine the amount of weight gain that is right for you.  Avoid raw meat and uncooked cheese. These carry germs that can cause birth defects in the baby.  If you have low calcium intake from food, talk to your health care provider about whether you should take a daily calcium supplement.  Eat four or five small meals rather than three large meals a day.  Limit foods that are high in fat and processed sugars, such as fried and sweet foods.  To prevent constipation: ? Drink enough fluid to keep your urine clear or pale yellow. ? Eat foods that are high in fiber, such as fresh fruits and vegetables, whole grains, and beans. Activity  Exercise only as directed by your health care provider. Most women can continue their usual exercise routine during pregnancy. Try to exercise for 30 minutes at least 5 days a week. Stop exercising if you experience uterine contractions.  Avoid heavy   lifting.  Do not exercise in extreme heat or humidity, or at high altitudes.  Wear low-heel, comfortable shoes.  Practice good posture.  You may continue to have sex unless your health care provider tells you otherwise. Relieving pain and discomfort  Take frequent breaks and rest with your legs elevated if you have leg cramps or low back pain.  Take warm sitz baths to soothe any pain or discomfort caused by hemorrhoids. Use hemorrhoid cream if your health care provider approves.  Wear a good support bra to prevent discomfort from breast tenderness.  If you develop varicose veins: ? Wear support pantyhose or compression stockings as told by your healthcare provider. ? Elevate your feet for 15 minutes, 3-4 times a day. Prenatal care  Write down your questions. Take them to your prenatal visits.  Keep all your prenatal visits as told by your health care provider. This is important. Safety  Wear your seat belt at  all times when driving.  Make a list of emergency phone numbers, including numbers for family, friends, the hospital, and police and fire departments. General instructions  Avoid cat litter boxes and soil used by cats. These carry germs that can cause birth defects in the baby. If you have a cat, ask someone to clean the litter box for you.  Do not travel far distances unless it is absolutely necessary and only with the approval of your health care provider.  Do not use hot tubs, steam rooms, or saunas.  Do not drink alcohol.  Do not use any products that contain nicotine or tobacco, such as cigarettes and e-cigarettes. If you need help quitting, ask your health care provider.  Do not use any medicinal herbs or unprescribed drugs. These chemicals affect the formation and growth of the baby.  Do not douche or use tampons or scented sanitary pads.  Do not cross your legs for long periods of time.  To prepare for the arrival of your baby: ? Take prenatal classes to understand, practice, and ask questions about labor and delivery. ? Make a trial run to the hospital. ? Visit the hospital and tour the maternity area. ? Arrange for maternity or paternity leave through employers. ? Arrange for family and friends to take care of pets while you are in the hospital. ? Purchase a rear-facing car seat and make sure you know how to install it in your car. ? Pack your hospital bag. ? Prepare the baby's nursery. Make sure to remove all pillows and stuffed animals from the baby's crib to prevent suffocation.  Visit your dentist if you have not gone during your pregnancy. Use a soft toothbrush to brush your teeth and be gentle when you floss. Contact a health care provider if:  You are unsure if you are in labor or if your water has broken.  You become dizzy.  You have mild pelvic cramps, pelvic pressure, or nagging pain in your abdominal area.  You have lower back pain.  You have persistent  nausea, vomiting, or diarrhea.  You have an unusual or bad smelling vaginal discharge.  You have pain when you urinate. Get help right away if:  Your water breaks before 37 weeks.  You have regular contractions less than 5 minutes apart before 37 weeks.  You have a fever.  You are leaking fluid from your vagina.  You have spotting or bleeding from your vagina.  You have severe abdominal pain or cramping.  You have rapid weight loss or weight gain.    You have shortness of breath with chest pain.  You notice sudden or extreme swelling of your face, hands, ankles, feet, or legs.  Your baby makes fewer than 10 movements in 2 hours.  You have severe headaches that do not go away when you take medicine.  You have vision changes. Summary  The third trimester is from week 28 through week 40, months 7 through 9. The third trimester is a time when the unborn baby (fetus) is growing rapidly.  During the third trimester, your discomfort may increase as you and your baby continue to gain weight. You may have abdominal, leg, and back pain, sleeping problems, and an increased need to urinate.  During the third trimester your breasts will keep growing and they will continue to become tender. A yellow fluid (colostrum) may leak from your breasts. This is the first milk you are producing for your baby.  False labor is a condition in which you feel small, irregular tightenings of the muscles in the womb (contractions) that eventually go away. These are called Braxton Hicks contractions. Contractions may last for hours, days, or even weeks before true labor sets in.  Signs of labor can include: abdominal cramps; regular contractions that start at 10 minutes apart and become stronger and more frequent with time; watery or bloody mucus discharge that comes from the vagina; increased pelvic pressure and dull back pain; and leaking of amniotic fluid. This information is not intended to replace advice  given to you by your health care provider. Make sure you discuss any questions you have with your health care provider. Document Released: 06/22/2001 Document Revised: 12/04/2015 Document Reviewed: 08/29/2012 Elsevier Interactive Patient Education  2017 [employer]  

## 2016-12-31 LAB — RPR: RPR: NONREACTIVE

## 2016-12-31 LAB — GLUCOSE TOLERANCE, 2 HOURS W/ 1HR
GLUCOSE, 1 HOUR: 157 mg/dL (ref 65–179)
GLUCOSE, 2 HOUR: 126 mg/dL (ref 65–152)
GLUCOSE, FASTING: 68 mg/dL (ref 65–91)

## 2016-12-31 LAB — HIV ANTIBODY (ROUTINE TESTING W REFLEX): HIV Screen 4th Generation wRfx: NONREACTIVE

## 2017-01-26 ENCOUNTER — Ambulatory Visit (INDEPENDENT_AMBULATORY_CARE_PROVIDER_SITE_OTHER): Payer: Medicaid Other | Admitting: Advanced Practice Midwife

## 2017-01-26 ENCOUNTER — Encounter: Payer: Self-pay | Admitting: Advanced Practice Midwife

## 2017-01-26 VITALS — BP 110/72 | HR 86 | Wt 174.8 lb

## 2017-01-26 DIAGNOSIS — O99713 Diseases of the skin and subcutaneous tissue complicating pregnancy, third trimester: Secondary | ICD-10-CM

## 2017-01-26 DIAGNOSIS — Z3483 Encounter for supervision of other normal pregnancy, third trimester: Secondary | ICD-10-CM

## 2017-01-26 DIAGNOSIS — L299 Pruritus, unspecified: Secondary | ICD-10-CM

## 2017-01-26 NOTE — Patient Instructions (Signed)

## 2017-01-26 NOTE — Progress Notes (Signed)
Complains of having itching in her hands

## 2017-01-27 LAB — COMPREHENSIVE METABOLIC PANEL
A/G RATIO: 1.5 (ref 1.2–2.2)
ALK PHOS: 104 IU/L (ref 39–117)
ALT: 8 IU/L (ref 0–32)
AST: 12 IU/L (ref 0–40)
Albumin: 3.7 g/dL (ref 3.5–5.5)
BUN/Creatinine Ratio: 11 (ref 9–23)
BUN: 6 mg/dL (ref 6–20)
CALCIUM: 8.6 mg/dL — AB (ref 8.7–10.2)
CO2: 20 mmol/L (ref 20–29)
Chloride: 104 mmol/L (ref 96–106)
Creatinine, Ser: 0.54 mg/dL — ABNORMAL LOW (ref 0.57–1.00)
GFR calc Af Amer: 151 mL/min/{1.73_m2} (ref >59)
GFR, EST NON AFRICAN AMERICAN: 131 mL/min/{1.73_m2} (ref >59)
GLOBULIN, TOTAL: 2.4 g/dL (ref 1.5–4.5)
Glucose: 89 mg/dL (ref 65–99)
POTASSIUM: 4 mmol/L (ref 3.5–5.2)
SODIUM: 138 mmol/L (ref 134–144)
Total Protein: 6.1 g/dL (ref 6.0–8.5)

## 2017-01-27 LAB — BILE ACIDS, TOTAL: Bile Acids Total: 5.4 umol/L (ref 4.7–24.5)

## 2017-01-28 NOTE — Progress Notes (Signed)
   PRENATAL VISIT NOTE  Subjective:  Alyssa Whitehead is a 26 y.o. G2P1001 at 60w3dbeing seen today for ongoing prenatal care.  She is currently monitored for the following issues for this high-risk pregnancy and has Supervision of normal pregnancy on her problem list.   Patient reports Itching on palms that does not resolve w/ Benadryl. Concerned about ICP. .Marland Kitchen Contractions: Irregular. Vag. Bleeding: None.  Movement: Present. Denies leaking of fluid.   The following portions of the patient's history were reviewed and updated as appropriate: allergies, current medications, past family history, past medical history, past social history, past surgical history and problem list. Problem list updated.  Objective:   Vitals:   01/26/17 1601  BP: 110/72  Pulse: 86  Weight: 174 lb 12.8 oz (79.3 kg)    Fetal Status: Fetal Heart Rate (bpm): 145 Fundal Height: 32 cm Movement: Present  Presentation: Vertex  General:  Alert, oriented and cooperative. Patient is in no acute distress.  Skin: Skin is warm and dry. No rash noted. No rash on hands.   Cardiovascular: Normal heart rate noted  Respiratory: Normal respiratory effort, no problems with respiration noted  Abdomen: Soft, gravid, appropriate for gestational age.  Pain/Pressure: Present     Pelvic: Cervical exam deferred        Extremities: Normal range of motion.  Edema: None  Mental Status:  Normal mood and affect. Normal behavior. Normal judgment and thought content.   Assessment and Plan:  Pregnancy: G2P1001 at 331w5d1. Encounter for supervision of other normal pregnancy in third trimester  - CBC (Not done w/ 28 week labs)  2. Pruritus of pregnancy in third trimester  - Bile acids, total - Comp Met (CMET)   Preterm labor symptoms and general obstetric precautions including but not limited to vaginal bleeding, contractions, leaking of fluid and fetal movement were reviewed in detail with the patient. Please refer to After Visit  Summary for other counseling recommendations.  Return in about 2 weeks (around 02/09/2017) for ROB.   ViManya SilvasCNM

## 2017-02-18 NOTE — Progress Notes (Signed)
BRX non-compliant

## 2017-02-21 ENCOUNTER — Other Ambulatory Visit (HOSPITAL_COMMUNITY)
Admission: RE | Admit: 2017-02-21 | Discharge: 2017-02-21 | Disposition: A | Discharge status: Home / Self Care | Payer: Medicaid Other | Source: Ambulatory Visit | Attending: Family Medicine | Admitting: Family Medicine

## 2017-02-21 ENCOUNTER — Ambulatory Visit (INDEPENDENT_AMBULATORY_CARE_PROVIDER_SITE_OTHER): Payer: Medicaid Other | Admitting: Family Medicine

## 2017-02-21 VITALS — BP 106/72 | HR 94 | Wt 182.0 lb

## 2017-02-21 DIAGNOSIS — Z3483 Encounter for supervision of other normal pregnancy, third trimester: Secondary | ICD-10-CM | POA: Diagnosis not present

## 2017-02-21 NOTE — Patient Instructions (Signed)
Breastfeeding Deciding to breastfeed is one of the best choices you can make for you and your baby. A change in hormones during pregnancy causes your breast tissue to grow and increases the number and size of your milk ducts. These hormones also allow proteins, sugars, and fats from your blood supply to make breast milk in your milk-producing glands. Hormones prevent breast milk from being released before your baby is born as well as prompt milk flow after birth. Once breastfeeding has begun, thoughts of your baby, as well as his or her sucking or crying, can stimulate the release of milk from your milk-producing glands. Benefits of breastfeeding For Your Baby  Your first milk (colostrum) helps your baby's digestive system function better.  There are antibodies in your milk that help your baby fight off infections.  Your baby has a lower incidence of asthma, allergies, and sudden infant death syndrome.  The nutrients in breast milk are better for your baby than infant formulas and are designed uniquely for your baby's needs.  Breast milk improves your baby's brain development.  Your baby is less likely to develop other conditions, such as childhood obesity, asthma, or type 2 diabetes mellitus.  For You  Breastfeeding helps to create a very special bond between you and your baby.  Breastfeeding is convenient. Breast milk is always available at the correct temperature and costs nothing.  Breastfeeding helps to burn calories and helps you lose the weight gained during pregnancy.  Breastfeeding makes your uterus contract to its prepregnancy size faster and slows bleeding (lochia) after you give birth.  Breastfeeding helps to lower your risk of developing type 2 diabetes mellitus, osteoporosis, and breast or ovarian cancer later in life.  Signs that your baby is hungry Early Signs of Hunger  Increased alertness or activity.  Stretching.  Movement of the head from side to  side.  Movement of the head and opening of the mouth when the corner of the mouth or cheek is stroked (rooting).  Increased sucking sounds, smacking lips, cooing, sighing, or squeaking.  Hand-to-mouth movements.  Increased sucking of fingers or hands.  Late Signs of Hunger  Fussing.  Intermittent crying.  Extreme Signs of Hunger Signs of extreme hunger will require calming and consoling before your baby will be able to breastfeed successfully. Do not wait for the following signs of extreme hunger to occur before you initiate breastfeeding:  Restlessness.  A loud, strong cry.  Screaming.  Breastfeeding basics Breastfeeding Initiation  Find a comfortable place to sit or lie down, with your neck and back well supported.  Place a pillow or rolled up blanket under your baby to bring him or her to the level of your breast (if you are seated). Nursing pillows are specially designed to help support your arms and your baby while you breastfeed.  Make sure that your baby's abdomen is facing your abdomen.  Gently massage your breast. With your fingertips, massage from your chest wall toward your nipple in a circular motion. This encourages milk flow. You may need to continue this action during the feeding if your milk flows slowly.  Support your breast with 4 fingers underneath and your thumb above your nipple. Make sure your fingers are well away from your nipple and your baby's mouth.  Stroke your baby's lips gently with your finger or nipple.  When your baby's mouth is open wide enough, quickly bring your baby to your breast, placing your entire nipple and as much of the colored area   around your nipple (areola) as possible into your baby's mouth. ? More areola should be visible above your baby's upper lip than below the lower lip. ? Your baby's tongue should be between his or her lower gum and your breast.  Ensure that your baby's mouth is correctly positioned around your nipple  (latched). Your baby's lips should create a seal on your breast and be turned out (everted).  It is common for your baby to suck about 2-3 minutes in order to start the flow of breast milk.  Latching Teaching your baby how to latch on to your breast properly is very important. An improper latch can cause nipple pain and decreased milk supply for you and poor weight gain in your baby. Also, if your baby is not latched onto your nipple properly, he or she may swallow some air during feeding. This can make your baby fussy. Burping your baby when you switch breasts during the feeding can help to get rid of the air. However, teaching your baby to latch on properly is still the best way to prevent fussiness from swallowing air while breastfeeding. Signs that your baby has successfully latched on to your nipple:  Silent tugging or silent sucking, without causing you pain.  Swallowing heard between every 3-4 sucks.  Muscle movement above and in front of his or her ears while sucking.  Signs that your baby has not successfully latched on to nipple:  Sucking sounds or smacking sounds from your baby while breastfeeding.  Nipple pain.  If you think your baby has not latched on correctly, slip your finger into the corner of your baby's mouth to break the suction and place it between your baby's gums. Attempt breastfeeding initiation again. Signs of Successful Breastfeeding Signs from your baby:  A gradual decrease in the number of sucks or complete cessation of sucking.  Falling asleep.  Relaxation of his or her body.  Retention of a small amount of milk in his or her mouth.  Letting go of your breast by himself or herself.  Signs from you:  Breasts that have increased in firmness, weight, and size 1-3 hours after feeding.  Breasts that are softer immediately after breastfeeding.  Increased milk volume, as well as a change in milk consistency and color by the fifth day of  breastfeeding.  Nipples that are not sore, cracked, or bleeding.  Signs That Your Baby is Getting Enough Milk  Wetting at least 1-2 diapers during the first 24 hours after birth.  Wetting at least 5-6 diapers every 24 hours for the first week after birth. The urine should be clear or pale yellow by 5 days after birth.  Wetting 6-8 diapers every 24 hours as your baby continues to grow and develop.  At least 3 stools in a 24-hour period by age 5 days. The stool should be soft and yellow.  At least 3 stools in a 24-hour period by age 7 days. The stool should be seedy and yellow.  No loss of weight greater than 10% of birth weight during the first 3 days of age.  Average weight gain of 4-7 ounces (113-198 g) per week after age 4 days.  Consistent daily weight gain by age 5 days, without weight loss after the age of 2 weeks.  After a feeding, your baby may spit up a small amount. This is common. Breastfeeding frequency and duration Frequent feeding will help you make more milk and can prevent sore nipples and breast engorgement. Breastfeed when   you feel the need to reduce the fullness of your breasts or when your baby shows signs of hunger. This is called "breastfeeding on demand." Avoid introducing a pacifier to your baby while you are working to establish breastfeeding (the first 4-6 weeks after your baby is born). After this time you may choose to use a pacifier. Research has shown that pacifier use during the first year of a baby's life decreases the risk of sudden infant death syndrome (SIDS). Allow your baby to feed on each breast as long as he or she wants. Breastfeed until your baby is finished feeding. When your baby unlatches or falls asleep while feeding from the first breast, offer the second breast. Because newborns are often sleepy in the first few weeks of life, you may need to awaken your baby to get him or her to feed. Breastfeeding times will vary from baby to baby. However,  the following rules can serve as a guide to help you ensure that your baby is properly fed:  Newborns (babies 4 weeks of age or younger) may breastfeed every 1-3 hours.  Newborns should not go longer than 3 hours during the day or 5 hours during the night without breastfeeding.  You should breastfeed your baby a minimum of 8 times in a 24-hour period until you begin to introduce solid foods to your baby at around 6 months of age.  Breast milk pumping Pumping and storing breast milk allows you to ensure that your baby is exclusively fed your breast milk, even at times when you are unable to breastfeed. This is especially important if you are going back to work while you are still breastfeeding or when you are not able to be present during feedings. Your lactation consultant can give you guidelines on how long it is safe to store breast milk. A breast pump is a machine that allows you to pump milk from your breast into a sterile bottle. The pumped breast milk can then be stored in a refrigerator or freezer. Some breast pumps are operated by hand, while others use electricity. Ask your lactation consultant which type will work best for you. Breast pumps can be purchased, but some hospitals and breastfeeding support groups lease breast pumps on a monthly basis. A lactation consultant can teach you how to hand express breast milk, if you prefer not to use a pump. Caring for your breasts while you breastfeed Nipples can become dry, cracked, and sore while breastfeeding. The following recommendations can help keep your breasts moisturized and healthy:  Avoid using soap on your nipples.  Wear a supportive bra. Although not required, special nursing bras and tank tops are designed to allow access to your breasts for breastfeeding without taking off your entire bra or top. Avoid wearing underwire-style bras or extremely tight bras.  Air dry your nipples for 3-4minutes after each feeding.  Use only cotton  bra pads to absorb leaked breast milk. Leaking of breast milk between feedings is normal.  Use lanolin on your nipples after breastfeeding. Lanolin helps to maintain your skin's normal moisture barrier. If you use pure lanolin, you do not need to wash it off before feeding your baby again. Pure lanolin is not toxic to your baby. You may also hand express a few drops of breast milk and gently massage that milk into your nipples and allow the milk to air dry.  In the first few weeks after giving birth, some women experience extremely full breasts (engorgement). Engorgement can make your   breasts feel heavy, warm, and tender to the touch. Engorgement peaks within 3-5 days after you give birth. The following recommendations can help ease engorgement:  Completely empty your breasts while breastfeeding or pumping. You may want to start by applying warm, moist heat (in the shower or with warm water-soaked hand towels) just before feeding or pumping. This increases circulation and helps the milk flow. If your baby does not completely empty your breasts while breastfeeding, pump any extra milk after he or she is finished.  Wear a snug bra (nursing or regular) or tank top for 1-2 days to signal your body to slightly decrease milk production.  Apply ice packs to your breasts, unless this is too uncomfortable for you.  Make sure that your baby is latched on and positioned properly while breastfeeding.  If engorgement persists after 48 hours of following these recommendations, contact your health care provider or a lactation consultant. Overall health care recommendations while breastfeeding  Eat healthy foods. Alternate between meals and snacks, eating 3 of each per day. Because what you eat affects your breast milk, some of the foods may make your baby more irritable than usual. Avoid eating these foods if you are sure that they are negatively affecting your baby.  Drink milk, fruit juice, and water to  satisfy your thirst (about 10 glasses a day).  Rest often, relax, and continue to take your prenatal vitamins to prevent fatigue, stress, and anemia.  Continue breast self-awareness checks.  Avoid chewing and smoking tobacco. Chemicals from cigarettes that pass into breast milk and exposure to secondhand smoke may harm your baby.  Avoid alcohol and drug use, including marijuana. Some medicines that may be harmful to your baby can pass through breast milk. It is important to ask your health care provider before taking any medicine, including all over-the-counter and prescription medicine as well as vitamin and herbal supplements. It is possible to become pregnant while breastfeeding. If birth control is desired, ask your health care provider about options that will be safe for your baby. Contact a health care provider if:  You feel like you want to stop breastfeeding or have become frustrated with breastfeeding.  You have painful breasts or nipples.  Your nipples are cracked or bleeding.  Your breasts are red, tender, or warm.  You have a swollen area on either breast.  You have a fever or chills.  You have nausea or vomiting.  You have drainage other than breast milk from your nipples.  Your breasts do not become full before feedings by the fifth day after you give birth.  You feel sad and depressed.  Your baby is too sleepy to eat well.  Your baby is having trouble sleeping.  Your baby is wetting less than 3 diapers in a 24-hour period.  Your baby has less than 3 stools in a 24-hour period.  Your baby's skin or the white part of his or her eyes becomes yellow.  Your baby is not gaining weight by 5 days of age. Get help right away if:  Your baby is overly tired (lethargic) and does not want to wake up and feed.  Your baby develops an unexplained fever. This information is not intended to replace advice given to you by your health care provider. Make sure you discuss  any questions you have with your health care provider. Document Released: 06/28/2005 Document Revised: 12/10/2015 Document Reviewed: 12/20/2012 Elsevier Interactive Patient Education  2017 [employer]  

## 2017-02-21 NOTE — Progress Notes (Signed)
   PRENATAL VISIT NOTE  Subjective:  Alyssa Whitehead is a 26 y.o. G2P1001 at 4321w1d being seen today for ongoing prenatal care.  She is currently monitored for the following issues for this low-risk pregnancy and has Supervision of normal pregnancy on her problem list.  Patient reports no complaints.  Contractions: Irregular. Vag. Bleeding: None.  Movement: Present. Denies leaking of fluid.   The following portions of the patient's history were reviewed and updated as appropriate: allergies, current medications, past family history, past medical history, past social history, past surgical history and problem list. Problem list updated.  Objective:   Vitals:   02/21/17 1404  BP: 106/72  Pulse: 94  Weight: 182 lb (82.6 kg)    Fetal Status: Fetal Heart Rate (bpm): 155 Fundal Height: 34 cm Movement: Present  Presentation: Vertex  General:  Alert, oriented and cooperative. Patient is in no acute distress.  Skin: Skin is warm and dry. No rash noted.   Cardiovascular: Normal heart rate noted  Respiratory: Normal respiratory effort, no problems with respiration noted  Abdomen: Soft, gravid, appropriate for gestational age.  Pain/Pressure: Present     Pelvic: Cervical exam performed Dilation: 1.5 Effacement (%): 50 Station: -2  Extremities: Normal range of motion.  Edema: Trace  Mental Status:  Normal mood and affect. Normal behavior. Normal judgment and thought content.   Assessment and Plan:  Pregnancy: G2P1001 at 5121w1d  1. Encounter for supervision of other normal pregnancy in third trimester 36 wks labs today - CBC - Strep Gp B NAA - Cervicovaginal ancillary only  Preterm labor symptoms and general obstetric precautions including but not limited to vaginal bleeding, contractions, leaking of fluid and fetal movement were reviewed in detail with the patient. Please refer to After Visit Summary for other counseling recommendations.  Return in 1 week (on 02/28/2017).   Reva Boresanya S Uriyah Raska,  MD

## 2017-02-22 LAB — CBC
Hematocrit: 35.4 % (ref 34.0–46.6)
Hemoglobin: 11.7 g/dL (ref 11.1–15.9)
MCH: 28.8 pg (ref 26.6–33.0)
MCHC: 33.1 g/dL (ref 31.5–35.7)
MCV: 87 fL (ref 79–97)
PLATELETS: 220 10*3/uL (ref 150–379)
RBC: 4.06 x10E6/uL (ref 3.77–5.28)
RDW: 13.5 % (ref 12.3–15.4)
WBC: 11.6 10*3/uL — AB (ref 3.4–10.8)

## 2017-02-23 ENCOUNTER — Encounter: Payer: Medicaid Other | Admitting: Student

## 2017-02-23 LAB — STREP GP B NAA: STREP GROUP B AG: NEGATIVE

## 2017-02-23 LAB — CERVICOVAGINAL ANCILLARY ONLY
CHLAMYDIA, DNA PROBE: NEGATIVE
Neisseria Gonorrhea: NEGATIVE

## 2017-02-28 ENCOUNTER — Ambulatory Visit (INDEPENDENT_AMBULATORY_CARE_PROVIDER_SITE_OTHER): Payer: Medicaid Other | Admitting: Family Medicine

## 2017-02-28 VITALS — BP 121/80 | HR 88 | Wt 185.0 lb

## 2017-02-28 DIAGNOSIS — Z3483 Encounter for supervision of other normal pregnancy, third trimester: Secondary | ICD-10-CM

## 2017-02-28 NOTE — Progress Notes (Signed)
   PRENATAL VISIT NOTE  Subjective:  Alyssa Whitehead is a 26 y.o. G2P1001 at [redacted]w[redacted]d being seen today for ongoing prenatal care.  She is currently monitored for the following issues for this low-risk pregnancy and has Supervision of normal pregnancy on her problem list.  Patient reports no complaints.  Contractions: Irregular. Vag. Bleeding: None.  Movement: Present. Denies leaking of fluid.   The following portions of the patient's history were reviewed and updated as appropriate: allergies, current medications, past family history, past medical history, past social history, past surgical history and problem list. Problem list updated.  Objective:   Vitals:   02/28/17 1540  BP: 121/80  Pulse: 88  Weight: 185 lb (83.9 kg)    Fetal Status: Fetal Heart Rate (bpm): 138 Fundal Height: 35 cm Movement: Present  Presentation: Vertex  General:  Alert, oriented and cooperative. Patient is in no acute distress.  Skin: Skin is warm and dry. No rash noted.   Cardiovascular: Normal heart rate noted  Respiratory: Normal respiratory effort, no problems with respiration noted  Abdomen: Soft, gravid, appropriate for gestational age.  Pain/Pressure: Present     Pelvic: Cervical exam deferred        Extremities: Normal range of motion.  Edema: None  Mental Status:  Normal mood and affect. Normal behavior. Normal judgment and thought content.   Assessment and Plan:  Pregnancy: G2P1001 at [redacted]w[redacted]d  1. Encounter for supervision of other normal pregnancy in third trimester Continue routine prenatal care. GBS negative.  Term labor symptoms and general obstetric precautions including but not limited to vaginal bleeding, contractions, leaking of fluid and fetal movement were reviewed in detail with the patient. Please refer to After Visit Summary for other counseling recommendations.  Return in 1 week (on 03/07/2017).   Reva Bores, MD

## 2017-02-28 NOTE — Patient Instructions (Signed)
 Third Trimester of Pregnancy The third trimester is from week 28 through week 40 (months 7 through 9). The third trimester is a time when the unborn baby (fetus) is growing rapidly. At the end of the ninth month, the fetus is about 20 inches in length and weighs 6-10 pounds. Body changes during your third trimester Your body will continue to go through many changes during pregnancy. The changes vary from woman to woman. During the third trimester:  Your weight will continue to increase. You can expect to gain 25-35 pounds (11-16 kg) by the end of the pregnancy.  You may begin to get stretch marks on your hips, abdomen, and breasts.  You may urinate more often because the fetus is moving lower into your pelvis and pressing on your bladder.  You may develop or continue to have heartburn. This is caused by increased hormones that slow down muscles in the digestive tract.  You may develop or continue to have constipation because increased hormones slow digestion and cause the muscles that push waste through your intestines to relax.  You may develop hemorrhoids. These are swollen veins (varicose veins) in the rectum that can itch or be painful.  You may develop swollen, bulging veins (varicose veins) in your legs.  You may have increased body aches in the pelvis, back, or thighs. This is due to weight gain and increased hormones that are relaxing your joints.  You may have changes in your hair. These can include thickening of your hair, rapid growth, and changes in texture. Some women also have hair loss during or after pregnancy, or hair that feels dry or thin. Your hair will most likely return to normal after your baby is born.  Your breasts will continue to grow and they will continue to become tender. A yellow fluid (colostrum) may leak from your breasts. This is the first milk you are producing for your baby.  Your belly button may stick out.  You may notice more swelling in your  hands, face, or ankles.  You may have increased tingling or numbness in your hands, arms, and legs. The skin on your belly may also feel numb.  You may feel short of breath because of your expanding uterus.  You may have more problems sleeping. This can be caused by the size of your belly, increased need to urinate, and an increase in your body's metabolism.  You may notice the fetus "dropping," or moving lower in your abdomen (lightening).  You may have increased vaginal discharge.  You may notice your joints feel loose and you may have pain around your pelvic bone.  What to expect at prenatal visits You will have prenatal exams every 2 weeks until week 36. Then you will have weekly prenatal exams. During a routine prenatal visit:  You will be weighed to make sure you and the baby are growing normally.  Your blood pressure will be taken.  Your abdomen will be measured to track your baby's growth.  The fetal heartbeat will be listened to.  Any test results from the previous visit will be discussed.  You may have a cervical check near your due date to see if your cervix has softened or thinned (effaced).  You will be tested for Group B streptococcus. This happens between 35 and 37 weeks.  Your health care provider may ask you:  What your birth plan is.  How you are feeling.  If you are feeling the baby move.  If you have   had any abnormal symptoms, such as leaking fluid, bleeding, severe headaches, or abdominal cramping.  If you are using any tobacco products, including cigarettes, chewing tobacco, and electronic cigarettes.  If you have any questions.  Other tests or screenings that may be performed during your third trimester include:  Blood tests that check for low iron levels (anemia).  Fetal testing to check the health, activity level, and growth of the fetus. Testing is done if you have certain medical conditions or if there are problems during the  pregnancy.  Nonstress test (NST). This test checks the health of your baby to make sure there are no signs of problems, such as the baby not getting enough oxygen. During this test, a belt is placed around your belly. The baby is made to move, and its heart rate is monitored during movement.  What is false labor? False labor is a condition in which you feel small, irregular tightenings of the muscles in the womb (contractions) that usually go away with rest, changing position, or drinking water. These are called Braxton Hicks contractions. Contractions may last for hours, days, or even weeks before true labor sets in. If contractions come at regular intervals, become more frequent, increase in intensity, or become painful, you should see your health care provider. What are the signs of labor?  Abdominal cramps.  Regular contractions that start at 10 minutes apart and become stronger and more frequent with time.  Contractions that start on the top of the uterus and spread down to the lower abdomen and back.  Increased pelvic pressure and dull back pain.  A watery or bloody mucus discharge that comes from the vagina.  Leaking of amniotic fluid. This is also known as your "water breaking." It could be a slow trickle or a gush. Let your health care provider know if it has a color or strange odor. If you have any of these signs, call your health care provider right away, even if it is before your due date. Follow these instructions at home: Medicines  Follow your health care provider's instructions regarding medicine use. Specific medicines may be either safe or unsafe to take during pregnancy.  Take a prenatal vitamin that contains at least 600 micrograms (mcg) of folic acid.  If you develop constipation, try taking a stool softener if your health care provider approves. Eating and drinking  Eat a balanced diet that includes fresh fruits and vegetables, whole grains, good sources of protein  such as meat, eggs, or tofu, and low-fat dairy. Your health care provider will help you determine the amount of weight gain that is right for you.  Avoid raw meat and uncooked cheese. These carry germs that can cause birth defects in the baby.  If you have low calcium intake from food, talk to your health care provider about whether you should take a daily calcium supplement.  Eat four or five small meals rather than three large meals a day.  Limit foods that are high in fat and processed sugars, such as fried and sweet foods.  To prevent constipation: ? Drink enough fluid to keep your urine clear or pale yellow. ? Eat foods that are high in fiber, such as fresh fruits and vegetables, whole grains, and beans. Activity  Exercise only as directed by your health care provider. Most women can continue their usual exercise routine during pregnancy. Try to exercise for 30 minutes at least 5 days a week. Stop exercising if you experience uterine contractions.  Avoid   heavy lifting.  Do not exercise in extreme heat or humidity, or at high altitudes.  Wear low-heel, comfortable shoes.  Practice good posture.  You may continue to have sex unless your health care provider tells you otherwise. Relieving pain and discomfort  Take frequent breaks and rest with your legs elevated if you have leg cramps or low back pain.  Take warm sitz baths to soothe any pain or discomfort caused by hemorrhoids. Use hemorrhoid cream if your health care provider approves.  Wear a good support bra to prevent discomfort from breast tenderness.  If you develop varicose veins: ? Wear support pantyhose or compression stockings as told by your healthcare provider. ? Elevate your feet for 15 minutes, 3-4 times a day. Prenatal care  Write down your questions. Take them to your prenatal visits.  Keep all your prenatal visits as told by your health care provider. This is important. Safety  Wear your seat belt at  all times when driving.  Make a list of emergency phone numbers, including numbers for family, friends, the hospital, and police and fire departments. General instructions  Avoid cat litter boxes and soil used by cats. These carry germs that can cause birth defects in the baby. If you have a cat, ask someone to clean the litter box for you.  Do not travel far distances unless it is absolutely necessary and only with the approval of your health care provider.  Do not use hot tubs, steam rooms, or saunas.  Do not drink alcohol.  Do not use any products that contain nicotine or tobacco, such as cigarettes and e-cigarettes. If you need help quitting, ask your health care provider.  Do not use any medicinal herbs or unprescribed drugs. These chemicals affect the formation and growth of the baby.  Do not douche or use tampons or scented sanitary pads.  Do not cross your legs for long periods of time.  To prepare for the arrival of your baby: ? Take prenatal classes to understand, practice, and ask questions about labor and delivery. ? Make a trial run to the hospital. ? Visit the hospital and tour the maternity area. ? Arrange for maternity or paternity leave through employers. ? Arrange for family and friends to take care of pets while you are in the hospital. ? Purchase a rear-facing car seat and make sure you know how to install it in your car. ? Pack your hospital bag. ? Prepare the baby's nursery. Make sure to remove all pillows and stuffed animals from the baby's crib to prevent suffocation.  Visit your dentist if you have not gone during your pregnancy. Use a soft toothbrush to brush your teeth and be gentle when you floss. Contact a health care provider if:  You are unsure if you are in labor or if your water has broken.  You become dizzy.  You have mild pelvic cramps, pelvic pressure, or nagging pain in your abdominal area.  You have lower back pain.  You have persistent  nausea, vomiting, or diarrhea.  You have an unusual or bad smelling vaginal discharge.  You have pain when you urinate. Get help right away if:  Your water breaks before 37 weeks.  You have regular contractions less than 5 minutes apart before 37 weeks.  You have a fever.  You are leaking fluid from your vagina.  You have spotting or bleeding from your vagina.  You have severe abdominal pain or cramping.  You have rapid weight loss or weight   gain.  You have shortness of breath with chest pain.  You notice sudden or extreme swelling of your face, hands, ankles, feet, or legs.  Your baby makes fewer than 10 movements in 2 hours.  You have severe headaches that do not go away when you take medicine.  You have vision changes. Summary  The third trimester is from week 28 through week 40, months 7 through 9. The third trimester is a time when the unborn baby (fetus) is growing rapidly.  During the third trimester, your discomfort may increase as you and your baby continue to gain weight. You may have abdominal, leg, and back pain, sleeping problems, and an increased need to urinate.  During the third trimester your breasts will keep growing and they will continue to become tender. A yellow fluid (colostrum) may leak from your breasts. This is the first milk you are producing for your baby.  False labor is a condition in which you feel small, irregular tightenings of the muscles in the womb (contractions) that eventually go away. These are called Braxton Hicks contractions. Contractions may last for hours, days, or even weeks before true labor sets in.  Signs of labor can include: abdominal cramps; regular contractions that start at 10 minutes apart and become stronger and more frequent with time; watery or bloody mucus discharge that comes from the vagina; increased pelvic pressure and dull back pain; and leaking of amniotic fluid. This information is not intended to replace advice  given to you by your health care provider. Make sure you discuss any questions you have with your health care provider. Document Released: 06/22/2001 Document Revised: 12/04/2015 Document Reviewed: 08/29/2012 [employer] Interactive Patient Education  2017 [employer]   Breastfeeding Deciding to breastfeed is one of the best choices you can make for you and your baby. A change in hormones during pregnancy causes your breast tissue to grow and increases the number and size of your milk ducts. These hormones also allow proteins, sugars, and fats from your blood supply to make breast milk in your milk-producing glands. Hormones prevent breast milk from being released before your baby is born as well as prompt milk flow after birth. Once breastfeeding has begun, thoughts of your baby, as well as his or her sucking or crying, can stimulate the release of milk from your milk-producing glands. Benefits of breastfeeding For Your Baby  Your first milk (colostrum) helps your baby's digestive system function better.  There are antibodies in your milk that help your baby fight off infections.  Your baby has a lower incidence of asthma, allergies, and sudden infant death syndrome.  The nutrients in breast milk are better for your baby than infant formulas and are designed uniquely for your baby's needs.  Breast milk improves your baby's brain development.  Your baby is less likely to develop other conditions, such as childhood obesity, asthma, or type 2 diabetes mellitus.  For You  Breastfeeding helps to create a very special bond between you and your baby.  Breastfeeding is convenient. Breast milk is always available at the correct temperature and costs nothing.  Breastfeeding helps to burn calories and helps you lose the weight gained during pregnancy.  Breastfeeding makes your uterus contract to its prepregnancy size faster and slows bleeding (lochia) after you give birth.  Breastfeeding helps  to lower your risk of developing type 2 diabetes mellitus, osteoporosis, and breast or ovarian cancer later in life.  Signs that your baby is hungry Early Signs of Hunger    Increased alertness or activity.  Stretching.  Movement of the head from side to side.  Movement of the head and opening of the mouth when the corner of the mouth or cheek is stroked (rooting).  Increased sucking sounds, smacking lips, cooing, sighing, or squeaking.  Hand-to-mouth movements.  Increased sucking of fingers or hands.  Late Signs of Hunger  Fussing.  Intermittent crying.  Extreme Signs of Hunger Signs of extreme hunger will require calming and consoling before your baby will be able to breastfeed successfully. Do not wait for the following signs of extreme hunger to occur before you initiate breastfeeding:  Restlessness.  A loud, strong cry.  Screaming.  Breastfeeding basics Breastfeeding Initiation  Find a comfortable place to sit or lie down, with your neck and back well supported.  Place a pillow or rolled up blanket under your baby to bring him or her to the level of your breast (if you are seated). Nursing pillows are specially designed to help support your arms and your baby while you breastfeed.  Make sure that your baby's abdomen is facing your abdomen.  Gently massage your breast. With your fingertips, massage from your chest wall toward your nipple in a circular motion. This encourages milk flow. You may need to continue this action during the feeding if your milk flows slowly.  Support your breast with 4 fingers underneath and your thumb above your nipple. Make sure your fingers are well away from your nipple and your baby's mouth.  Stroke your baby's lips gently with your finger or nipple.  When your baby's mouth is open wide enough, quickly bring your baby to your breast, placing your entire nipple and as much of the colored area around your nipple (areola) as possible into  your baby's mouth. ? More areola should be visible above your baby's upper lip than below the lower lip. ? Your baby's tongue should be between his or her lower gum and your breast.  Ensure that your baby's mouth is correctly positioned around your nipple (latched). Your baby's lips should create a seal on your breast and be turned out (everted).  It is common for your baby to suck about 2-3 minutes in order to start the flow of breast milk.  Latching Teaching your baby how to latch on to your breast properly is very important. An improper latch can cause nipple pain and decreased milk supply for you and poor weight gain in your baby. Also, if your baby is not latched onto your nipple properly, he or she may swallow some air during feeding. This can make your baby fussy. Burping your baby when you switch breasts during the feeding can help to get rid of the air. However, teaching your baby to latch on properly is still the best way to prevent fussiness from swallowing air while breastfeeding. Signs that your baby has successfully latched on to your nipple:  Silent tugging or silent sucking, without causing you pain.  Swallowing heard between every 3-4 sucks.  Muscle movement above and in front of his or her ears while sucking.  Signs that your baby has not successfully latched on to nipple:  Sucking sounds or smacking sounds from your baby while breastfeeding.  Nipple pain.  If you think your baby has not latched on correctly, slip your finger into the corner of your baby's mouth to break the suction and place it between your baby's gums. Attempt breastfeeding initiation again. Signs of Successful Breastfeeding Signs from your baby:  A   gradual decrease in the number of sucks or complete cessation of sucking.  Falling asleep.  Relaxation of his or her body.  Retention of a small amount of milk in his or her mouth.  Letting go of your breast by himself or herself.  Signs from  you:  Breasts that have increased in firmness, weight, and size 1-3 hours after feeding.  Breasts that are softer immediately after breastfeeding.  Increased milk volume, as well as a change in milk consistency and color by the fifth day of breastfeeding.  Nipples that are not sore, cracked, or bleeding.  Signs That Your Baby is Getting Enough Milk  Wetting at least 1-2 diapers during the first 24 hours after birth.  Wetting at least 5-6 diapers every 24 hours for the first week after birth. The urine should be clear or pale yellow by 5 days after birth.  Wetting 6-8 diapers every 24 hours as your baby continues to grow and develop.  At least 3 stools in a 24-hour period by age 5 days. The stool should be soft and yellow.  At least 3 stools in a 24-hour period by age 7 days. The stool should be seedy and yellow.  No loss of weight greater than 10% of birth weight during the first 3 days of age.  Average weight gain of 4-7 ounces (113-198 g) per week after age 4 days.  Consistent daily weight gain by age 5 days, without weight loss after the age of 2 weeks.  After a feeding, your baby may spit up a small amount. This is common. Breastfeeding frequency and duration Frequent feeding will help you make more milk and can prevent sore nipples and breast engorgement. Breastfeed when you feel the need to reduce the fullness of your breasts or when your baby shows signs of hunger. This is called "breastfeeding on demand." Avoid introducing a pacifier to your baby while you are working to establish breastfeeding (the first 4-6 weeks after your baby is born). After this time you may choose to use a pacifier. Research has shown that pacifier use during the first year of a baby's life decreases the risk of sudden infant death syndrome (SIDS). Allow your baby to feed on each breast as long as he or she wants. Breastfeed until your baby is finished feeding. When your baby unlatches or falls asleep  while feeding from the first breast, offer the second breast. Because newborns are often sleepy in the first few weeks of life, you may need to awaken your baby to get him or her to feed. Breastfeeding times will vary from baby to baby. However, the following rules can serve as a guide to help you ensure that your baby is properly fed:  Newborns (babies 4 weeks of age or younger) may breastfeed every 1-3 hours.  Newborns should not go longer than 3 hours during the day or 5 hours during the night without breastfeeding.  You should breastfeed your baby a minimum of 8 times in a 24-hour period until you begin to introduce solid foods to your baby at around 6 months of age.  Breast milk pumping Pumping and storing breast milk allows you to ensure that your baby is exclusively fed your breast milk, even at times when you are unable to breastfeed. This is especially important if you are going back to work while you are still breastfeeding or when you are not able to be present during feedings. Your lactation consultant can give you guidelines on how   long it is safe to store breast milk. A breast pump is a machine that allows you to pump milk from your breast into a sterile bottle. The pumped breast milk can then be stored in a refrigerator or freezer. Some breast pumps are operated by hand, while others use electricity. Ask your lactation consultant which type will work best for you. Breast pumps can be purchased, but some hospitals and breastfeeding support groups lease breast pumps on a monthly basis. A lactation consultant can teach you how to hand express breast milk, if you prefer not to use a pump. Caring for your breasts while you breastfeed Nipples can become dry, cracked, and sore while breastfeeding. The following recommendations can help keep your breasts moisturized and healthy:  Avoid using soap on your nipples.  Wear a supportive bra. Although not required, special nursing bras and tank  tops are designed to allow access to your breasts for breastfeeding without taking off your entire bra or top. Avoid wearing underwire-style bras or extremely tight bras.  Air dry your nipples for 3-4minutes after each feeding.  Use only cotton bra pads to absorb leaked breast milk. Leaking of breast milk between feedings is normal.  Use lanolin on your nipples after breastfeeding. Lanolin helps to maintain your skin's normal moisture barrier. If you use pure lanolin, you do not need to wash it off before feeding your baby again. Pure lanolin is not toxic to your baby. You may also hand express a few drops of breast milk and gently massage that milk into your nipples and allow the milk to air dry.  In the first few weeks after giving birth, some women experience extremely full breasts (engorgement). Engorgement can make your breasts feel heavy, warm, and tender to the touch. Engorgement peaks within 3-5 days after you give birth. The following recommendations can help ease engorgement:  Completely empty your breasts while breastfeeding or pumping. You may want to start by applying warm, moist heat (in the shower or with warm water-soaked hand towels) just before feeding or pumping. This increases circulation and helps the milk flow. If your baby does not completely empty your breasts while breastfeeding, pump any extra milk after he or she is finished.  Wear a snug bra (nursing or regular) or tank top for 1-2 days to signal your body to slightly decrease milk production.  Apply ice packs to your breasts, unless this is too uncomfortable for you.  Make sure that your baby is latched on and positioned properly while breastfeeding.  If engorgement persists after 48 hours of following these recommendations, contact your health care provider or a lactation consultant. Overall health care recommendations while breastfeeding  Eat healthy foods. Alternate between meals and snacks, eating 3 of each per  day. Because what you eat affects your breast milk, some of the foods may make your baby more irritable than usual. Avoid eating these foods if you are sure that they are negatively affecting your baby.  Drink milk, fruit juice, and water to satisfy your thirst (about 10 glasses a day).  Rest often, relax, and continue to take your prenatal vitamins to prevent fatigue, stress, and anemia.  Continue breast self-awareness checks.  Avoid chewing and smoking tobacco. Chemicals from cigarettes that pass into breast milk and exposure to secondhand smoke may harm your baby.  Avoid alcohol and drug use, including marijuana. Some medicines that may be harmful to your baby can pass through breast milk. It is important to ask your health care   provider before taking any medicine, including all over-the-counter and prescription medicine as well as vitamin and herbal supplements. It is possible to become pregnant while breastfeeding. If birth control is desired, ask your health care provider about options that will be safe for your baby. Contact a health care provider if:  You feel like you want to stop breastfeeding or have become frustrated with breastfeeding.  You have painful breasts or nipples.  Your nipples are cracked or bleeding.  Your breasts are red, tender, or warm.  You have a swollen area on either breast.  You have a fever or chills.  You have nausea or vomiting.  You have drainage other than breast milk from your nipples.  Your breasts do not become full before feedings by the fifth day after you give birth.  You feel sad and depressed.  Your baby is too sleepy to eat well.  Your baby is having trouble sleeping.  Your baby is wetting less than 3 diapers in a 24-hour period.  Your baby has less than 3 stools in a 24-hour period.  Your baby's skin or the white part of his or her eyes becomes yellow.  Your baby is not gaining weight by 5 days of age. Get help right away  if:  Your baby is overly tired (lethargic) and does not want to wake up and feed.  Your baby develops an unexplained fever. This information is not intended to replace advice given to you by your health care provider. Make sure you discuss any questions you have with your health care provider. Document Released: 06/28/2005 Document Revised: 12/10/2015 Document Reviewed: 12/20/2012 Elsevier Interactive Patient Education  2017 Elsevier Inc.  

## 2017-03-09 ENCOUNTER — Ambulatory Visit (INDEPENDENT_AMBULATORY_CARE_PROVIDER_SITE_OTHER): Payer: Medicaid Other | Admitting: Obstetrics & Gynecology

## 2017-03-09 VITALS — BP 127/82 | HR 79 | Wt 185.0 lb

## 2017-03-09 DIAGNOSIS — Z3480 Encounter for supervision of other normal pregnancy, unspecified trimester: Secondary | ICD-10-CM

## 2017-03-09 NOTE — Progress Notes (Signed)
   PRENATAL VISIT NOTE  Subjective:  Alyssa Whitehead is a 26 y.o. G2P1001 at 7636w3d being seen today for ongoing prenatal care.  She is currently monitored for the following issues for this low-risk pregnancy and has Supervision of normal pregnancy on her problem list.  Patient reports no complaints.  Contractions: Irregular. Vag. Bleeding: None.  Movement: Present. Denies leaking of fluid.   The following portions of the patient's history were reviewed and updated as appropriate: allergies, current medications, past family history, past medical history, past social history, past surgical history and problem list. Problem list updated.  Objective:   Vitals:   03/09/17 0951  BP: 127/82  Pulse: 79  Weight: 185 lb (83.9 kg)    Fetal Status: Fetal Heart Rate (bpm): 156   Movement: Present     General:  Alert, oriented and cooperative. Patient is in no acute distress.  Skin: Skin is warm and dry. No rash noted.   Cardiovascular: Normal heart rate noted  Respiratory: Normal respiratory effort, no problems with respiration noted  Abdomen: Soft, gravid, appropriate for gestational age.  Pain/Pressure: Present     Pelvic: Cervical exam deferred        Extremities: Normal range of motion.  Edema: None  Mental Status:  Normal mood and affect. Normal behavior. Normal judgment and thought content.   Assessment and Plan:  Pregnancy: G2P1001 at 6936w3d  1. Supervision of other normal pregnancy, antepartum   Term labor symptoms and general obstetric precautions including but not limited to vaginal bleeding, contractions, leaking of fluid and fetal movement were reviewed in detail with the patient. Please refer to After Visit Summary for other counseling recommendations.  No Follow-up on file.   Allie BossierMyra C Terrilynn Postell, MD

## 2017-03-15 ENCOUNTER — Ambulatory Visit (INDEPENDENT_AMBULATORY_CARE_PROVIDER_SITE_OTHER): Payer: Medicaid Other | Admitting: Obstetrics and Gynecology

## 2017-03-15 VITALS — BP 120/82 | HR 80 | Wt 186.0 lb

## 2017-03-15 DIAGNOSIS — Z3483 Encounter for supervision of other normal pregnancy, third trimester: Secondary | ICD-10-CM

## 2017-03-15 DIAGNOSIS — Z3480 Encounter for supervision of other normal pregnancy, unspecified trimester: Secondary | ICD-10-CM

## 2017-03-15 NOTE — Progress Notes (Signed)
Prenatal Visit Note Date: 03/15/2017 Clinic: [facility]  Subjective:  Alyssa Whitehead is a 26 y.o. G2P1001 at 4062w2d being seen today for ongoing prenatal care.  She is currently monitored for the following issues for this low-risk pregnancy and has Supervision of normal pregnancy on her problem list.  Patient reports no complaints.   Contractions: Irregular. Vag. Bleeding: None.  Movement: Present. Denies leaking of fluid.   The following portions of the patient's history were reviewed and updated as appropriate: allergies, current medications, past family history, past medical history, past social history, past surgical history and problem list. Problem list updated.  Objective:   Vitals:   03/15/17 1004  BP: 120/82  Pulse: 80  Weight: 186 lb (84.4 kg)    Fetal Status: Fetal Heart Rate (bpm): 152 Fundal Height: 39 cm Movement: Present  Presentation: Vertex  General:  Alert, oriented and cooperative. Patient is in no acute distress.  Skin: Skin is warm and dry. No rash noted.   Cardiovascular: Normal heart rate noted  Respiratory: Normal respiratory effort, no problems with respiration noted  Abdomen: Soft, gravid, appropriate for gestational age. Pain/Pressure: Present     Pelvic:  Cervical exam performed Dilation: 1.5 Effacement (%): 50 Station: -3  Extremities: Normal range of motion.  Edema: None  Mental Status: Normal mood and affect. Normal behavior. Normal judgment and thought content.   Urinalysis:      Assessment and Plan:  Pregnancy: G2P1001 at 3762w2d  1. Supervision of other normal pregnancy, antepartum Routine care. Set up iol nv.   Term labor symptoms and general obstetric precautions including but not limited to vaginal bleeding, contractions, leaking of fluid and fetal movement were reviewed in detail with the patient. Please refer to After Visit Summary for other counseling recommendations.  Return in about 6 days (around 03/21/2017) for rob  and set up iol.   [place] BingPickens, Kaydra Borgen, MD

## 2017-03-22 ENCOUNTER — Other Ambulatory Visit: Payer: Self-pay | Admitting: Advanced Practice Midwife

## 2017-03-23 ENCOUNTER — Telehealth (HOSPITAL_COMMUNITY): Payer: Self-pay | Admitting: *Deleted

## 2017-03-23 ENCOUNTER — Ambulatory Visit (INDEPENDENT_AMBULATORY_CARE_PROVIDER_SITE_OTHER): Payer: Medicaid Other | Admitting: Obstetrics and Gynecology

## 2017-03-23 VITALS — BP 114/80 | HR 85 | Wt 187.0 lb

## 2017-03-23 DIAGNOSIS — O480 Post-term pregnancy: Secondary | ICD-10-CM

## 2017-03-23 DIAGNOSIS — Z3483 Encounter for supervision of other normal pregnancy, third trimester: Secondary | ICD-10-CM

## 2017-03-23 NOTE — Addendum Note (Signed)
Addended by: Arne ClevelandHUTCHINSON, Anup Brigham J on: 03/23/2017 10:26 AM   Modules accepted: Orders

## 2017-03-23 NOTE — Telephone Encounter (Signed)
Preadmission screen  

## 2017-03-23 NOTE — Progress Notes (Signed)
   PRENATAL VISIT NOTE  Subjective:  Alyssa Whitehead is a 26 y.o. G2P1001 at 6474w3d being seen today for ongoing prenatal care.  She is currently monitored for the following issues for this low-risk pregnancy and has Supervision of normal pregnancy on her problem list.  Patient reports no complaints.  Contractions: Irregular. Vag. Bleeding: None.  Movement: Present. Denies leaking of fluid.   The following portions of the patient's history were reviewed and updated as appropriate: allergies, current medications, past family history, past medical history, past social history, past surgical history and problem list. Problem list updated.  Objective:   Vitals:   03/23/17 0909  BP: 114/80  Pulse: 85  Weight: 187 lb (84.8 kg)    Fetal Status: Fetal Heart Rate (bpm): NST   Movement: Present  Presentation: Vertex  General:  Alert, oriented and cooperative. Patient is in no acute distress.  Skin: Skin is warm and dry. No rash noted.   Cardiovascular: Normal heart rate noted  Respiratory: Normal respiratory effort, no problems with respiration noted  Abdomen: Soft, gravid, appropriate for gestational age.  Pain/Pressure: Present     Pelvic: Cervical exam performed Dilation: 2 Effacement (%): 50 Station: -3  Extremities: Normal range of motion.  Edema: None  Mental Status:  Normal mood and affect. Normal behavior. Normal judgment and thought content.   Assessment and Plan:  Pregnancy: G2P1001 at 7874w3d  1. Encounter for supervision of other normal pregnancy in third trimester Patient is doing well without complaints NST reviewed and reactive with baseline 130, mod variability, +accels, no decels Patient scheduled for IOL on 9/16  Term labor symptoms and general obstetric precautions including but not limited to vaginal bleeding, contractions, leaking of fluid and fetal movement were reviewed in detail with the patient. Please refer to After Visit Summary for other counseling recommendations.   Return in about 6 weeks (around 05/04/2017) for postpartum visit.   Catalina AntiguaPeggy Kenyata Guess, MD

## 2017-03-23 NOTE — Patient Instructions (Signed)

## 2017-03-25 ENCOUNTER — Inpatient Hospital Stay (HOSPITAL_COMMUNITY)
Admission: AD | Admit: 2017-03-25 | Discharge: 2017-03-26 | DRG: 775 | Disposition: A | Discharge status: Home / Self Care | Payer: Medicaid Other | Source: Ambulatory Visit | Attending: Obstetrics and Gynecology | Admitting: Obstetrics and Gynecology

## 2017-03-25 ENCOUNTER — Encounter (HOSPITAL_COMMUNITY): Payer: Self-pay

## 2017-03-25 DIAGNOSIS — Z3493 Encounter for supervision of normal pregnancy, unspecified, third trimester: Secondary | ICD-10-CM | POA: Diagnosis present

## 2017-03-25 DIAGNOSIS — Z87891 Personal history of nicotine dependence: Secondary | ICD-10-CM | POA: Diagnosis not present

## 2017-03-25 DIAGNOSIS — Z3A40 40 weeks gestation of pregnancy: Secondary | ICD-10-CM

## 2017-03-25 LAB — RPR: RPR Ser Ql: NONREACTIVE

## 2017-03-25 LAB — CBC
HEMATOCRIT: 38.9 % (ref 36.0–46.0)
HEMOGLOBIN: 13.6 g/dL (ref 12.0–15.0)
MCH: 29.2 pg (ref 26.0–34.0)
MCHC: 35 g/dL (ref 30.0–36.0)
MCV: 83.7 fL (ref 78.0–100.0)
Platelets: 225 10*3/uL (ref 150–400)
RBC: 4.65 MIL/uL (ref 3.87–5.11)
RDW: 14.3 % (ref 11.5–15.5)
WBC: 17.4 10*3/uL — ABNORMAL HIGH (ref 4.0–10.5)

## 2017-03-25 LAB — TYPE AND SCREEN
ABO/RH(D): A POS
Antibody Screen: NEGATIVE

## 2017-03-25 LAB — ABO/RH: ABO/RH(D): A POS

## 2017-03-25 MED ORDER — LACTATED RINGERS IV SOLN: 500.0000 mL | Freq: Once | INTRAVENOUS | Status: DC

## 2017-03-25 MED ORDER — OXYCODONE-ACETAMINOPHEN 5-325 MG PO TABS
1.0000 | ORAL_TABLET | ORAL | Status: DC | PRN
  Administered 2017-03-25: 1 via ORAL
  Filled 2017-03-25: qty 1

## 2017-03-25 MED ORDER — LACTATED RINGERS IV SOLN: 500.0000 mL | INTRAVENOUS | Status: DC | PRN

## 2017-03-25 MED ORDER — DIPHENHYDRAMINE HCL 25 MG PO CAPS: 25.0000 mg | ORAL_CAPSULE | Freq: Four times a day (QID) | ORAL | Status: DC | PRN

## 2017-03-25 MED ORDER — METHYLERGONOVINE MALEATE 0.2 MG PO TABS: 0.2000 mg | ORAL_TABLET | ORAL | Status: DC | PRN

## 2017-03-25 MED ORDER — EPHEDRINE 5 MG/ML INJ
10.0000 mg | INTRAVENOUS | Status: DC | PRN
  Filled 2017-03-25: qty 2

## 2017-03-25 MED ORDER — OXYCODONE HCL 5 MG PO TABS: 10.0000 mg | ORAL_TABLET | ORAL | Status: DC | PRN

## 2017-03-25 MED ORDER — SIMETHICONE 80 MG PO CHEW: 80.0000 mg | CHEWABLE_TABLET | ORAL | Status: DC | PRN

## 2017-03-25 MED ORDER — MEASLES, MUMPS & RUBELLA VAC ~~LOC~~ INJ
0.5000 mL | INJECTION | Freq: Once | SUBCUTANEOUS | Status: DC
  Filled 2017-03-25: qty 0.5

## 2017-03-25 MED ORDER — PRENATAL MULTIVITAMIN CH
1.0000 | ORAL_TABLET | Freq: Every day | ORAL | Status: DC
  Administered 2017-03-25: 1 via ORAL
  Filled 2017-03-25: qty 1

## 2017-03-25 MED ORDER — OXYCODONE HCL 5 MG PO TABS
5.0000 mg | ORAL_TABLET | ORAL | Status: DC | PRN
  Administered 2017-03-25: 5 mg via ORAL
  Filled 2017-03-25: qty 1

## 2017-03-25 MED ORDER — BENZOCAINE-MENTHOL 20-0.5 % EX AERO
1.0000 "application " | INHALATION_SPRAY | CUTANEOUS | Status: DC | PRN
  Administered 2017-03-25: 1 via TOPICAL
  Filled 2017-03-25: qty 56

## 2017-03-25 MED ORDER — LACTATED RINGERS IV SOLN
INTRAVENOUS | Status: DC
  Administered 2017-03-25: 08:00:00 via INTRAVENOUS

## 2017-03-25 MED ORDER — FENTANYL CITRATE (PF) 100 MCG/2ML IJ SOLN: 50.0000 ug | Freq: Once | INTRAMUSCULAR | Status: DC

## 2017-03-25 MED ORDER — FERROUS SULFATE 325 (65 FE) MG PO TABS
325.0000 mg | ORAL_TABLET | Freq: Two times a day (BID) | ORAL | Status: DC
  Administered 2017-03-25 – 2017-03-26 (×2): 325 mg via ORAL
  Filled 2017-03-25 (×2): qty 1

## 2017-03-25 MED ORDER — OXYTOCIN BOLUS FROM INFUSION
500.0000 mL | Freq: Once | INTRAVENOUS | Status: AC
  Administered 2017-03-25: 500 mL via INTRAVENOUS

## 2017-03-25 MED ORDER — ZOLPIDEM TARTRATE 5 MG PO TABS: 5.0000 mg | ORAL_TABLET | Freq: Every evening | ORAL | Status: DC | PRN

## 2017-03-25 MED ORDER — PHENYLEPHRINE 40 MCG/ML (10ML) SYRINGE FOR IV PUSH (FOR BLOOD PRESSURE SUPPORT)
80.0000 ug | PREFILLED_SYRINGE | INTRAVENOUS | Status: DC | PRN
  Filled 2017-03-25: qty 5

## 2017-03-25 MED ORDER — BISACODYL 10 MG RE SUPP
10.0000 mg | Freq: Every day | RECTAL | Status: DC | PRN
  Filled 2017-03-25: qty 1

## 2017-03-25 MED ORDER — DIBUCAINE 1 % RE OINT: 1.0000 "application " | TOPICAL_OINTMENT | RECTAL | Status: DC | PRN

## 2017-03-25 MED ORDER — FENTANYL 2.5 MCG/ML BUPIVACAINE 1/10 % EPIDURAL INFUSION (WH - ANES)
14.0000 mL/h | INTRAMUSCULAR | Status: DC | PRN
Start: 2017-03-25 — End: 2017-03-25

## 2017-03-25 MED ORDER — IBUPROFEN 600 MG PO TABS
600.0000 mg | ORAL_TABLET | Freq: Four times a day (QID) | ORAL | Status: DC
  Administered 2017-03-25 – 2017-03-26 (×4): 600 mg via ORAL
  Filled 2017-03-25 (×4): qty 1

## 2017-03-25 MED ORDER — ONDANSETRON HCL 4 MG/2ML IJ SOLN: 4.0000 mg | Freq: Four times a day (QID) | INTRAMUSCULAR | Status: DC | PRN

## 2017-03-25 MED ORDER — FENTANYL CITRATE (PF) 100 MCG/2ML IJ SOLN
INTRAMUSCULAR | Status: AC
  Filled 2017-03-25: qty 2

## 2017-03-25 MED ORDER — COCONUT OIL OIL: 1.0000 "application " | TOPICAL_OIL | Status: DC | PRN

## 2017-03-25 MED ORDER — OXYTOCIN 40 UNITS IN LACTATED RINGERS INFUSION - SIMPLE MED
2.5000 [IU]/h | INTRAVENOUS | Status: DC
  Administered 2017-03-25: 2.5 [IU]/h via INTRAVENOUS
  Filled 2017-03-25: qty 1000

## 2017-03-25 MED ORDER — LIDOCAINE HCL (PF) 1 % IJ SOLN
30.0000 mL | INTRAMUSCULAR | Status: DC | PRN
  Administered 2017-03-25: 30 mL via SUBCUTANEOUS
  Filled 2017-03-25: qty 30

## 2017-03-25 MED ORDER — FLEET ENEMA 7-19 GM/118ML RE ENEM: 1.0000 | ENEMA | Freq: Every day | RECTAL | Status: DC | PRN

## 2017-03-25 MED ORDER — SOD CITRATE-CITRIC ACID 500-334 MG/5ML PO SOLN: 30.0000 mL | ORAL | Status: DC | PRN

## 2017-03-25 MED ORDER — OXYCODONE-ACETAMINOPHEN 5-325 MG PO TABS: 2.0000 | ORAL_TABLET | ORAL | Status: DC | PRN

## 2017-03-25 MED ORDER — ONDANSETRON HCL 4 MG PO TABS: 4.0000 mg | ORAL_TABLET | ORAL | Status: DC | PRN

## 2017-03-25 MED ORDER — METHYLERGONOVINE MALEATE 0.2 MG/ML IJ SOLN: 0.2000 mg | INTRAMUSCULAR | Status: DC | PRN

## 2017-03-25 MED ORDER — TETANUS-DIPHTH-ACELL PERTUSSIS 5-2.5-18.5 LF-MCG/0.5 IM SUSP: 0.5000 mL | Freq: Once | INTRAMUSCULAR | Status: DC

## 2017-03-25 MED ORDER — ACETAMINOPHEN 325 MG PO TABS: 650.0000 mg | ORAL_TABLET | ORAL | Status: DC | PRN

## 2017-03-25 MED ORDER — ACETAMINOPHEN 325 MG PO TABS
650.0000 mg | ORAL_TABLET | ORAL | Status: DC | PRN
  Administered 2017-03-25: 650 mg via ORAL
  Filled 2017-03-25: qty 2

## 2017-03-25 MED ORDER — WITCH HAZEL-GLYCERIN EX PADS: 1.0000 "application " | MEDICATED_PAD | CUTANEOUS | Status: DC | PRN

## 2017-03-25 MED ORDER — ONDANSETRON HCL 4 MG/2ML IJ SOLN: 4.0000 mg | INTRAMUSCULAR | Status: DC | PRN

## 2017-03-25 MED ORDER — DOCUSATE SODIUM 100 MG PO CAPS
100.0000 mg | ORAL_CAPSULE | Freq: Two times a day (BID) | ORAL | Status: DC
  Administered 2017-03-25 – 2017-03-26 (×2): 100 mg via ORAL
  Filled 2017-03-25 (×2): qty 1

## 2017-03-25 MED ORDER — DIPHENHYDRAMINE HCL 50 MG/ML IJ SOLN: 12.5000 mg | INTRAMUSCULAR | Status: DC | PRN

## 2017-03-25 NOTE — MAU Note (Signed)
Patient c/o contractions 2-84minutes.Taken directly to mau bed;patient uncomfortable and requesting epidural. FHR 140's. SVE 5-6cm.MD called and made aware. Admit orders received. Patient taken directly to admit bed on L/D.

## 2017-03-25 NOTE — H&P (Signed)
Alyssa Whitehead is a 26 y.o. female G2P1001 with IUP at [redacted]w[redacted]d presenting for laobr. She was 4-5 in MAU and quickl progressed to C/C/0    Past Medical History: Past Medical History:  Diagnosis Date  . Other seasonal allergic rhinitis 04/02/2014  . Radius distal fracture LT   FELL OFF HORSE 09-18-11  LT    Past Surgical History: Past Surgical History:  Procedure Laterality Date  . WRIST FRACTURE SURGERY  2013    Obstetrical History: OB History    Gravida Para Term Preterm AB Living   SAB TAB Ectopic Multiple Live Births           1       Social History: Social History   Social History  . Marital status: Single    Spouse name: N/A  . Number of children: N/A  . Years of education: N/A   Social History Main Topics  . Smoking status: Former Smoker    Packs/day: 0.00    Types: Cigarettes    Quit date: 07/01/2016  . Smokeless tobacco: Never Used  . Alcohol use No     Comment: ocassionally   . Drug use: No  . Sexual activity: Yes    Birth control/ protection: None   Other Topics Concern  . None   Social History Narrative  . None    Family History: Family History  Problem Relation Age of Onset  . Cervical cancer Mother   . Cervical cancer Maternal Grandmother   . Cancer Maternal Grandfather 73       bladder and lung  . Anesthesia problems Neg Hx   . Hypotension Neg Hx   . Malignant hyperthermia Neg Hx   . Pseudochol deficiency Neg Hx     Allergies: No Known Allergies  Prescriptions Prior to Admission  Medication Sig Dispense Refill Last Dose  . Prenatal Vit-Fe Fumarate-FA (PRENATAL VITAMIN PO) Take 1 tablet by mouth daily.   Taking        Review of Systems   Constitutional: Negative for fever and chills Eyes: Negative for visual disturbances Respiratory: Negative for shortness of breath, dyspnea Cardiovascular: Negative for chest pain or palpitations  Gastrointestinal: Negative for vomiting, diarrhea and constipation.  POSITIVE  for abdominal pain (contractions) Genitourinary: Negative for dysuria and urgency Musculoskeletal: Negative for back pain, joint pain, myalgias  Neurological: Negative for dizziness and headaches      Height  (1.6 m), weight 84.8 kg (187 lb), last menstrual period 05/12/2016. General appearance: alert, cooperative and mild distress Lungs: clear to auscultation bilaterally Heart: regular rate and rhythm Abdomen: soft, non-tender; bowel sounds normal Extremities: Homans sign is negative, no sign of DVT DTR's 2+ Presentation: cephalic Fetal monitoring  Baseline: 140 bpm, Variability: Good {> 6 bpm), Accelerations: Reactive and Decelerations: Variable: mild Uterine activity  q 2 Dilation: 5.5 Effacement (%): 80 Station: -2 Exam by:: Alyssa Whitehead   Prenatal labs: ABO, Rh: A/Positive/-- (02/21 1450) Antibody: Negative (02/21 1450) Rubella: !Error! RPR: Non Reactive (06/21 0830)  HBsAg: Negative (02/21 1450)  HIV:   neg GBS: Negative (08/13 1420)    Prenatal Transfer Tool  Maternal Diabetes: No Genetic Screening: Normal Maternal Ultrasounds/Referrals: Normal Fetal Ultrasounds or other Referrals:  None Maternal Substance Abuse:  No Significant Maternal Medications:  None Significant Maternal Lab Results: Lab values include: Group B Strep negative   Clinic  [facility] Prenatal Labs  Dating  [facility] 9/9 (12wk u/s ) Blood type:  A/Positive/-- (02/21 1450) A pos  Genetic Screen 1 Screen: NT neg AFP: Negative Antibody:Negative (02/21 1450)neg  Anatomic [place]  Normal Rubella: 1.62 (02/21 1450)imm  GDM screening 28wks: Normal 2 hr RPR: Non Reactive (02/21 1450) neg  Flu vaccine Declined HBsAg: Negative (02/21 1450) neg  TDaP vaccine  12/30/16 HIV: Non Reactive (02/21 1450) neg  Baby Food Breast                                 GBS:  neg  Contraception OCPs Pap: neg 2016  Circumcision Yes   Pediatrician  [facility] Neg cf 97, Hemoglobinopathy panel. Low risk sma   Support Person  FOB: Alyssa Whitehead        No results found for this or any previous visit (from the past 24 hour(s)).  Assessment: Alyssa Whitehead is a 26 y.o. G2P1001 with an IUP at [redacted]w[redacted]d presenting for active labor  Plan: #Labor: expectant management; SVD soon #Pain:  Per request #FWB Cat 1  CRESENZO-DISHMAN,Bobbye Petti 03/25/2017, 7:54 AM

## 2017-03-25 NOTE — Progress Notes (Signed)
UR chart review completed.  

## 2017-03-25 NOTE — Lactation Note (Signed)
This note was copied from a baby's chart. Lactation Consultation Note  Patient Name: Alyssa Whitehead [id number] Date: 03/25/2017 Reason for consult: Initial assessment   P2 6 hours old.  Mother states she chose to stop breastfeeding at 34-67 weeks old with her daughter. She states she would like to breastfeed longer with this baby. Mother hand expressed drops of colostrum on spoon and we gave to baby. Baby is spitty.  Baby spit up colostrum and spit up when attempting to breastfeed. Attempted in football hold but baby was too sleepy. Encouraged attempted again within 1--2 hours by undressing baby for feeding and placing STS. Discussed basics and encouraged spoon feeding until baby latches. Mom encouraged to feed baby 8-12 times/24 hours and with feeding cues.  Mom made aware of O/P services, breastfeeding support groups, community resources, and our phone # for post-discharge questions.  Suggest mother call if she would like assistance w/ latching.   Maternal Data Has patient been taught Hand Expression?: Yes Does the patient have breastfeeding experience prior to this delivery?: Yes  Feeding Feeding Type: Breast Fed Length of feed:  (few sucks)  LATCH Score Latch: Repeated attempts needed to sustain latch, nipple held in mouth throughout feeding, stimulation needed to elicit sucking reflex.  Audible Swallowing: A few with stimulation  Type of Nipple: Everted at rest and after stimulation  Comfort (Breast/Nipple): Soft / non-tender  Hold (Positioning): Assistance needed to correctly position infant at breast and maintain latch.  LATCH Score: 7  Interventions Interventions: Assisted with latch;Hand express;Position options;Support pillows  Lactation Tools Discussed/Used     Consult Status Consult Status: Follow-up Date: 03/26/17 Follow-up type: Out-patient    Alyssa Whitehead [facility] 03/25/2017, 3:02 PM

## 2017-03-26 MED ORDER — IBUPROFEN 600 MG PO TABS: 600.0000 mg | ORAL_TABLET | Freq: Four times a day (QID) | ORAL | 0 refills | Status: DC | PRN

## 2017-03-26 NOTE — Lactation Note (Signed)
This note was copied from a baby's chart. Lactation Consultation Note: Mother reports that infant is feeding well. She denies having any nipple pain only slight tenderness. Mother advised to hand express frequently and apply to nipple. Mother reports that she has been using a nipple shield that she brought from home. She reports that she has only used it 2 times. She last breastfed without the shield. She reports that her sister told her that it would keep her from getting sore. Mother advised that it can be a barrier and that she needs to check to make sure that milk is in the shield when infant releases the breast. Mother has an electric pump at home. She hopes to breastfeed this infant longer than the other child. Mother was offered a harmony hand pump and declined. Informed mother of number and LC services to call if needed more support. Mother advised in prevention and treatment of engorgement and reviewed S/S of Mastitis. Encouraged to continue to cue base feed and to feed infant at least 8-12 times in 24 hours. Mother receptive to all teaching.   Patient Name: Alyssa Whitehead Alyssa Whitehead Date: 03/26/2017 Reason for consult: Follow-up assessment   Maternal Data    Feeding    LATCH Score                   Interventions    Lactation Tools Discussed/Used     Consult Status Consult Status: Complete    Alyssa Whitehead 03/26/2017, 11:51 AM

## 2017-03-26 NOTE — Discharge Summary (Signed)
OB Discharge Summary     Patient Name: Alyssa Whitehead DOB: 06-05-1991 MRN: [id number]  Date of admission: 03/25/2017 Delivering MD: KEY, Corrie Dandy K   Date of discharge: 03/26/2017  Admitting diagnosis: 41wks ctx Intrauterine pregnancy: [redacted]w[redacted]d     Secondary diagnosis:  Active Problems:   Spontaneous vaginal delivery   Normal labor  Additional problems:  Patient Active Problem List   Diagnosis Date Noted  . Normal labor 03/25/2017  . Supervision of normal pregnancy 09/01/2016  . Spontaneous vaginal delivery 05/12/2011        Discharge diagnosis: Term Pregnancy Delivered                                                                                                Post partum procedures: N/A  Augmentation: None  Complications: None  Hospital course:  Onset of Labor With Vaginal Delivery     26 y.o. yo [id number] at [redacted]w[redacted]d was admitted in Active Labor on 03/25/2017. Patient had an uncomplicated labor course as follows:  Membrane Rupture Time/Date: 7:49 AM ,03/25/2017   Intrapartum Procedures: Episiotomy: None [1]                                         Lacerations:  2nd degree [3]  Patient had a delivery of a Viable infant. 03/25/2017  Information for the patient's newborn:  Franki, Alcaide [[id number]]  Delivery Method: Vag-Vacuum    Pateint had an uncomplicated postpartum course.  She is ambulating, tolerating a regular diet, passing flatus, and urinating well. Patient is discharged home in stable condition on 03/26/17 as an early discharge per pt request.   Physical exam  Vitals:   03/25/17 0950 03/25/17 1055 03/25/17 1440 03/25/17 2335  BP: 118/87 124/75 118/71 116/70  Pulse: 70 73 81 80  Resp: Temp: 98.1 F (36.7 C) 99 F (37.2 C) 97.9 F (36.6 C) 97.9 F (36.6 C)  TempSrc: Oral Oral Oral Oral  Weight:      Height:       General: alert, cooperative and no distress Lochia: appropriate Uterine Fundus: firm Incision: N/A DVT Evaluation: No  evidence of DVT seen on physical exam. Labs: Lab Results  Component Value Date   WBC 17.4 (H) 03/25/2017   HGB 13.6 03/25/2017   HCT 38.9 03/25/2017   MCV 83.7 03/25/2017   PLT 225 03/25/2017   CMP Latest Ref Rng & Units 01/26/2017  Glucose 65 - 99 mg/dL 89  BUN 6 - 20 mg/dL 6  Creatinine 1.30 - 8.65 mg/dL 7.84(O)  Sodium 962 - 952 mmol/L 138  Potassium 3.5 - 5.2 mmol/L 4.0  Chloride 96 - 106 mmol/L 104  CO2 20 - 29 mmol/L 20  Calcium 8.7 - 10.2 mg/dL 8.4(X)  Total Protein 6.0 - 8.5 g/dL 6.1  Total Bilirubin 0.0 - 1.2 mg/dL <3.2  Alkaline Phos 39 - 117 IU/L 104  AST 0 - 40 IU/L 12  ALT 0 - 32 IU/L 8  Discharge instruction: per After Visit Summary and "Baby and Me Booklet".  After visit meds:  Allergies as of 03/26/2017   No Known Allergies     Medication List    STOP taking these medications   OVER THE COUNTER MEDICATION     TAKE these medications   ibuprofen 600 MG tablet Commonly known as:  ADVIL,MOTRIN Take 1 tablet (600 mg total) by mouth every 6 (six) hours as needed for moderate pain or cramping.            Discharge Care Instructions        Start     Ordered   03/26/17 0000  ibuprofen (ADVIL,MOTRIN) 600 MG tablet  Every 6 hours PRN     03/26/17 0732      Diet: routine diet  Activity: Advance as tolerated. Pelvic rest for 6 weeks.   Outpatient follow up:6 weeks; patient to call and make appointment Follow up Appt:No future appointments. Follow up Visit:No Follow-up on file.  Postpartum contraception: Progesterone only pills  Newborn Data: Live born female  Birth Weight: 7 lb 1.6 oz (3221 g) APGAR: 9, 9  Baby Feeding: Breast Disposition:home with mother   03/26/2017 Larene Beach, DO PGY-2 Family Medicine Resident  CNM attestation I have seen and examined this patient and agree with above documentation in the resident's note.   Alyssa Whitehead is a 26 y.o. G2P2002 s/p SVD.   Pain is well controlled.  Plan for birth control is oral  progesterone-only contraceptive.  Method of Feeding: breast  PE:  BP 116/67 (BP Location: Right Arm)   Pulse 77   Temp 98 F (36.7 C) (Oral)   Resp 18   Ht  (1.6 m)   Wt 84.8 kg (187 lb)   LMP 05/12/2016 (LMP Unknown)   Breastfeeding? Unknown   BMI 33.13 kg/m  Fundus firm   Recent Labs  03/25/17 0740  HGB 13.6  HCT 38.9     Plan: discharge today - postpartum care discussed - f/u clinic in 4 weeks for postpartum visit   Cam Hai, CNM 9:29 AM 03/26/2017

## 2017-03-26 NOTE — Discharge Instructions (Signed)

## 2017-03-27 ENCOUNTER — Inpatient Hospital Stay (HOSPITAL_COMMUNITY): Admission: RE | Admit: 2017-03-27 | Payer: Medicaid Other | Source: Ambulatory Visit

## 2017-03-29 ENCOUNTER — Telehealth: Payer: Self-pay | Admitting: Radiology

## 2017-03-29 NOTE — Telephone Encounter (Signed)
Left a message on patients cell phone voicemail to call [email or web address] to schedule postpartum appointment, pt delivered 03/25/17.

## 2018-12-22 IMAGING — US US MFM OB FOLLOW-UP
1 series · 14 of 28 positions shown · non-contrast
Comparison: none

[Series 1: us mfm ob follow-up · 65 acquisitions, 14 frames shown]
[im 3/65]
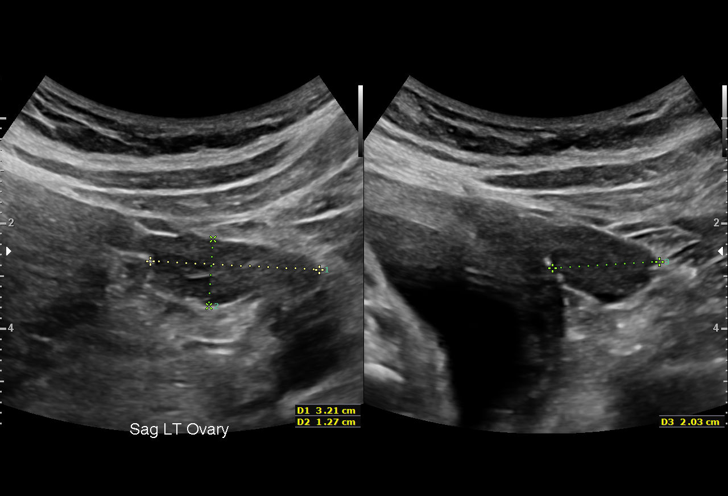
[im 8/65]
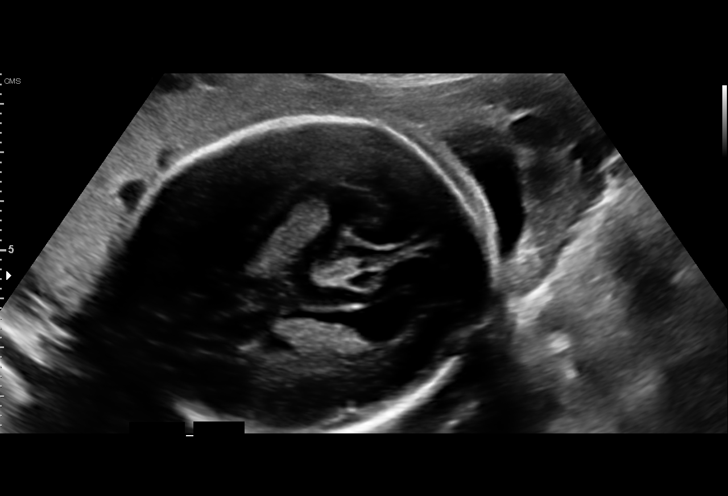
[im 12/65]
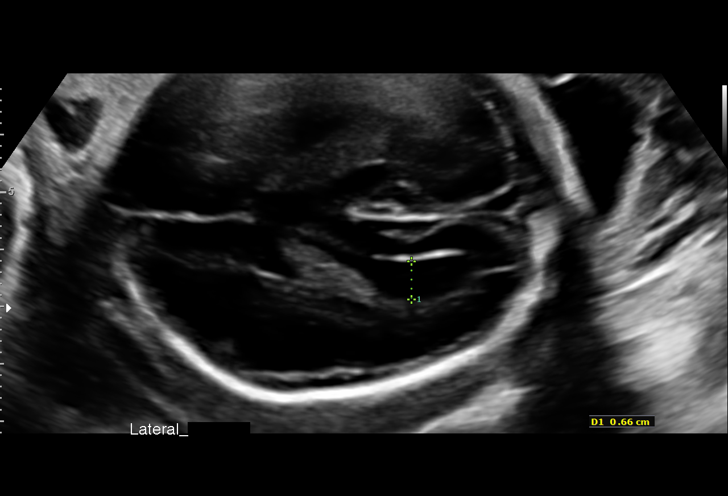
[im 17/65]
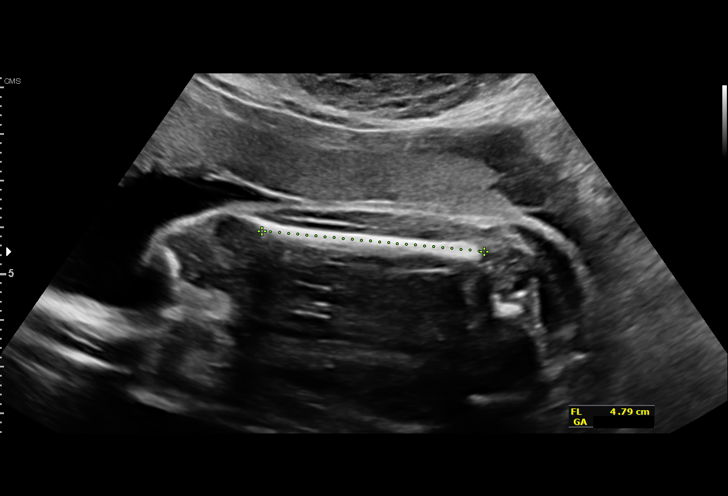
[im 22/65]
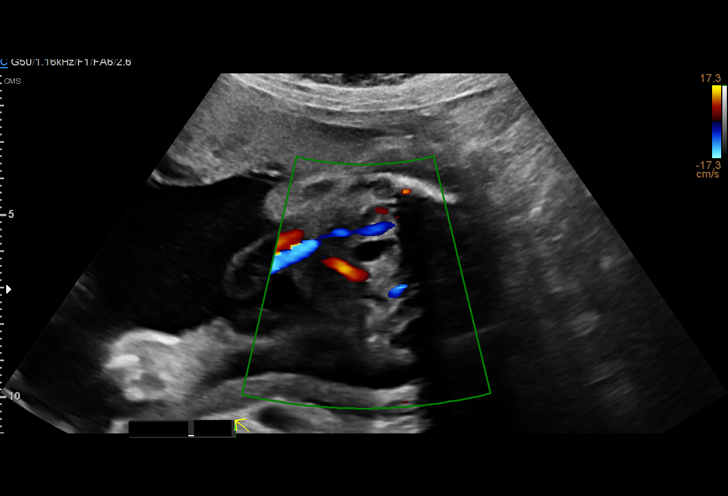
[im 27/65]
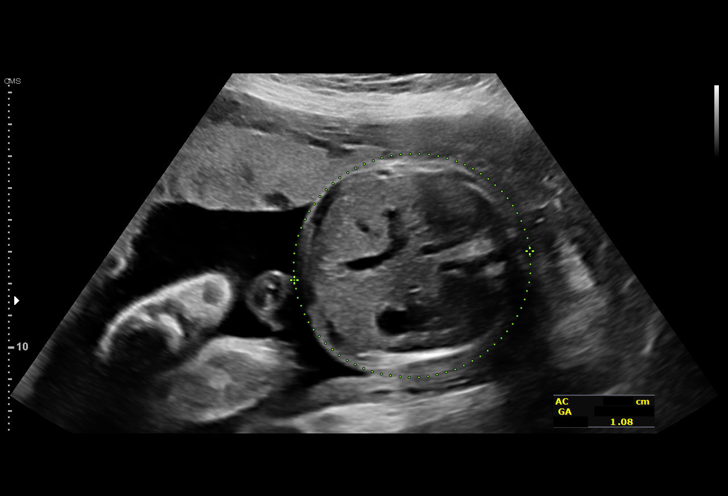
[im 31/65]
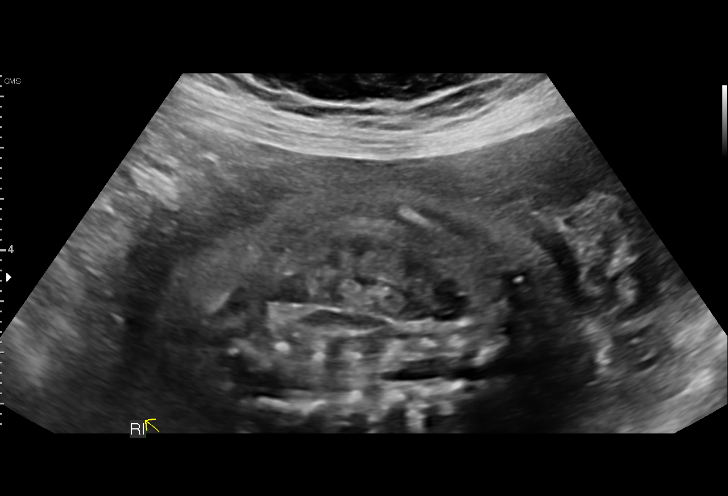
[im 36/65]
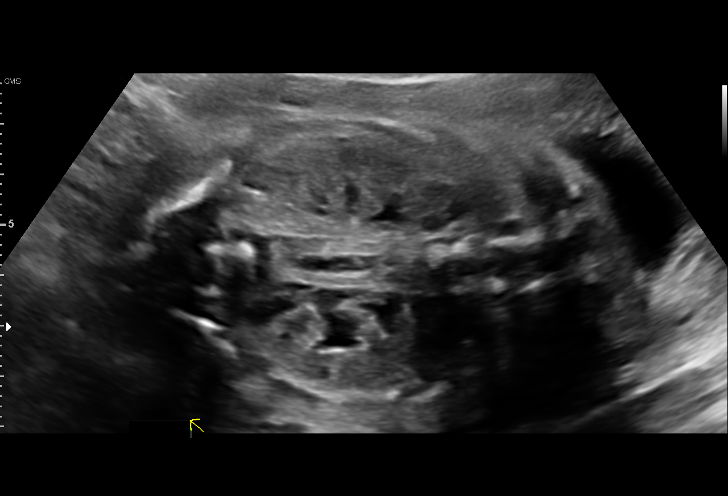
[im 41/65]
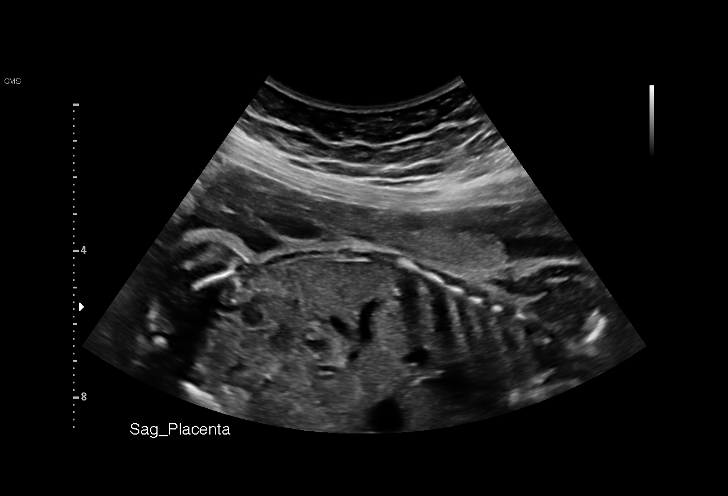
[im 46/65]
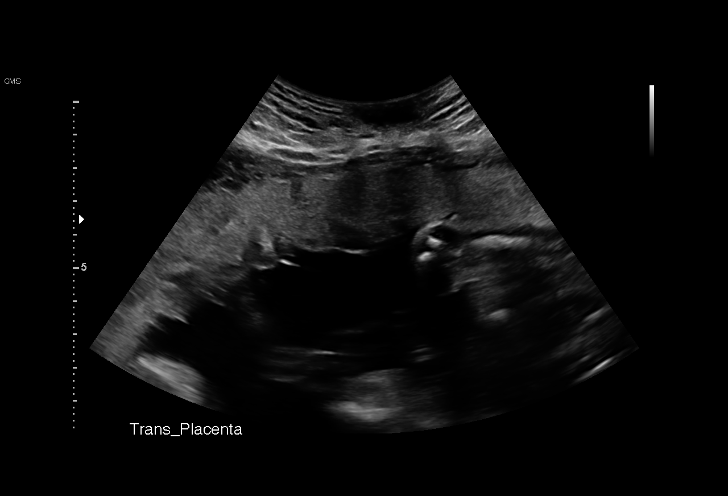
[im 50/65]
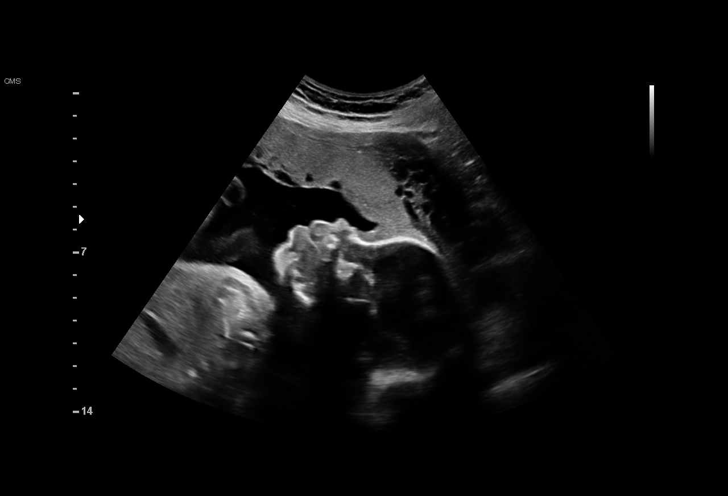
[im 55/65]
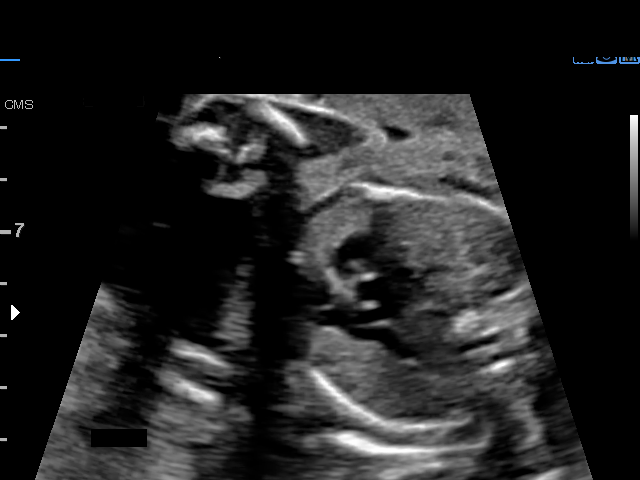
[im 60/65]
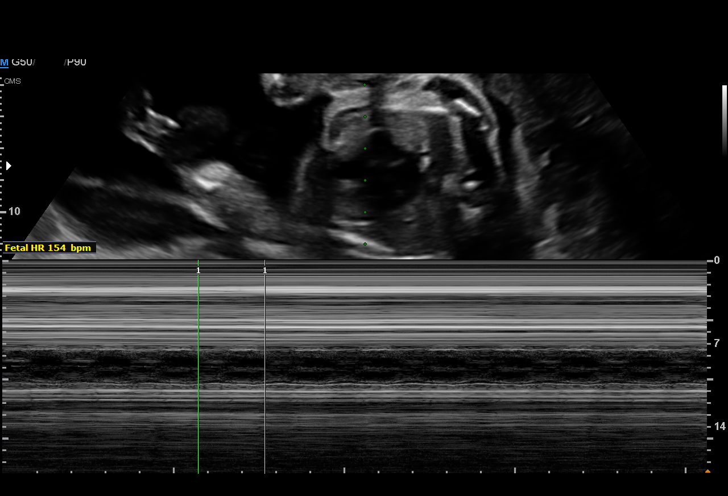
[im 65/65]
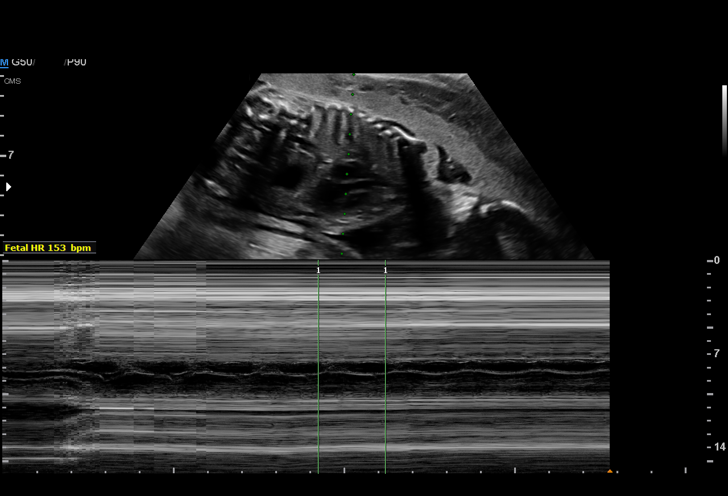

[14 of 28 positions shown; findings below may reference images not displayed]

1  CHUKZY BUBA           [id number]      [id number]     [id number]
Indications

26 weeks gestation of pregnancy
Fetal abnormality - other known or
suspected (bilateral CPCs)
Encounter for other antenatal screening
follow-up
OB History

Gravidity:    2         Term:   1        Prem:   0        SAB:   0
TOP:          0       Ectopic:  0        Living: 1
Fetal Evaluation

Num Of Fetuses:     1
Fetal Heart         154
Rate(bpm):
Cardiac Activity:   Observed
Presentation:       Cephalic
Placenta:           Anterior, above cervical os
P. Cord Insertion:  Visualized, central

Amniotic Fluid
AFI FV:      Subjectively within normal limits

Largest Pocket(cm)
8.32
Biometry

BPD:      66.3  mm     G. Age:  26w 5d         50  %    CI:        73.61   %   70 - 86
FL/HC:      19.4   %   18.6 -
HC:      245.5  mm     G. Age:  26w 5d         32  %    HC/AC:      1.09       1.04 -
AC:      224.9  mm     G. Age:  26w 6d         56  %    FL/BPD:     71.8   %   71 - 87
FL:       47.6  mm     G. Age:  26w 0d         22  %    FL/AC:      21.2   %   20 - 24

Est. FW:     947  gm      2 lb 1 oz     55  %
Gestational Age

LMP:           31w 0d       Date:   05/12/16                 EDD:   02/16/17
U/S Today:     26w 4d                                        EDD:   03/19/17
Anatomy

Cranium:               Appears normal         Aortic Arch:            Previously seen
Cavum:                 Appears normal         Ductal Arch:            Previously seen
Ventricles:            Appears normal         Diaphragm:              Appears normal
Choroid Plexus:        Appears normal         Stomach:                Appears normal, left
sided
Cerebellum:            Previously seen        Abdomen:                Appears normal
Posterior Fossa:       Previously seen        Abdominal Wall:         Appears nml (cord
insert, abd wall)
Nuchal Fold:           Previously seen        Cord Vessels:           Appears normal (3
vessel cord)
Face:                  Orbits and profile     Kidneys:                Appear normal
previously seen
Lips:                  Appears normal         Bladder:                Appears normal
Thoracic:              Appears normal         Spine:                  Previously seen
Heart:                 Appears normal         Upper Extremities:      Previously seen
(4CH, axis, and
situs)
RVOT:                  Appears normal         Lower Extremities:      Previously seen
LVOT:                  Appears normal

Other:  Fetus appears to be a male. Heels previously seen.. Left 5th digit
previously seen.
Cervix Uterus Adnexa

Cervix
Length:           3.89  cm.
Normal appearance by transabdominal scan.

Uterus
No abnormality visualized.

Left Ovary
Within normal limits.

Right Ovary
Not visualized.

Adnexa:       No abnormality visualized.
Impression

Singleton intrauterine pregnancy at 26+3 weeks
Review of the anatomy shows no sonographic markers for
aneuploidy or structural anomalies
The previously noted choroid plexus cysts have resolved
Amniotic fluid volume is normal
Estimated fetal weight is 947g which is growth in the 55th
percentile
Recommendations

Follow-up ultrasounds as clinically indicated.

## 2021-09-09 ENCOUNTER — Encounter: Payer: Self-pay | Admitting: Adult Health

## 2021-09-09 ENCOUNTER — Ambulatory Visit (INDEPENDENT_AMBULATORY_CARE_PROVIDER_SITE_OTHER): Payer: Medicaid Other | Admitting: Adult Health

## 2021-09-09 ENCOUNTER — Other Ambulatory Visit: Payer: Self-pay

## 2021-09-09 ENCOUNTER — Other Ambulatory Visit (HOSPITAL_COMMUNITY)
Admission: RE | Admit: 2021-09-09 | Discharge: 2021-09-09 | Disposition: A | Discharge status: Home / Self Care | Payer: Medicaid Other | Source: Ambulatory Visit | Attending: Adult Health | Admitting: Adult Health

## 2021-09-09 VITALS — BP 121/81 | HR 80 | Ht 63.0 in | Wt 143.0 lb

## 2021-09-09 DIAGNOSIS — Z01419 Encounter for gynecological examination (general) (routine) without abnormal findings: Secondary | ICD-10-CM | POA: Insufficient documentation

## 2021-09-09 DIAGNOSIS — Z13220 Encounter for screening for lipoid disorders: Secondary | ICD-10-CM | POA: Diagnosis not present

## 2021-09-09 DIAGNOSIS — Z1329 Encounter for screening for other suspected endocrine disorder: Secondary | ICD-10-CM | POA: Diagnosis not present

## 2021-09-09 NOTE — Progress Notes (Signed)
Patient ID: VELVA MOLINARI, female   DOB: 09-28-1990, 31 y.o.   MRN: [id number] ?History of Present Illness: ?Kandis Cocking is a 31 year old white female,married, G2P2, in for a well woman gyn exam and pap. She is a new pt. ? ? ? ?Current Medications, Allergies, Past Medical History, Past Surgical History, Family History and Social History were reviewed in [facility] record.   ? ? ?Review of Systems: ?Patient denies any headaches, hearing loss, fatigue, blurred vision, shortness of breath, chest pain, abdominal pain, problems with bowel movements, urination, or intercourse. No joint pain or mood swings.  ? ? ? ?Physical Exam:BP 121/81 (BP Location: Right Arm, Patient Position: Sitting, Cuff Size: Normal)   Pulse 80   Ht 5\' 3"  (1.6 m)   Wt 143 lb (64.9 kg)   LMP 08/16/2021 (Exact Date)   Breastfeeding No   BMI 25.33 kg/m?   ?General:  Well developed, well nourished, no acute distress ?Skin:  Warm and dry ?Neck:  Midline trachea, normal thyroid, good ROM, no lymphadenopathy ?Lungs; Clear to auscultation bilaterally ?Breast:  No dominant palpable mass, retraction, or nipple discharge ?Cardiovascular: Regular rate and rhythm ?Abdomen:  Soft, non tender, no hepatosplenomegaly ?Pelvic:  External genitalia is normal in appearance, no lesions.  The vagina is normal in appearance. Urethra has no lesions or masses. The cervix is bulbous,and everted at os, pap with HR HPV genotyping. Uterus is felt to be normal size, shape, and contour.  No adnexal masses or tenderness noted.Bladder is non tender, no masses felt. ?Rectal WAS DEFERRED. ?Extremities/musculoskeletal:  No swelling or varicosities noted, no clubbing or cyanosis ?Psych:  No mood changes, alert and cooperative,seems happy ?AA is 4 ?Fall risk is low ?Depression screen [facility] 2/9 09/09/2021  ?Decreased Interest 0  ?Down, Depressed, Hopeless 1  ?PHQ - 2 Score 1  ?Altered sleeping 0  ?Tired, decreased energy 1  ?Change in appetite 0  ?Feeling bad or  failure about yourself  1  ?Trouble concentrating 1  ?Moving slowly or fidgety/restless 0  ?Suicidal thoughts 0  ?PHQ-9 Score 4  ?  ?GAD 7 : Generalized Anxiety Score 09/09/2021  ?Nervous, Anxious, on Edge 1  ?Control/stop worrying 1  ?Worry too much - different things 1  ?Trouble relaxing 0  ?Restless 0  ?Easily annoyed or irritable 2  ?Afraid - awful might happen 1  ?Total GAD 7 Score 6  ? ? [place] - 09/09/21 0959   ? ?  ? Pregnancy Intention Screening  ? Does the patient want to become pregnant in the next year? No   ? Does the patient's partner want to become pregnant in the next year? No   ? Would the patient like to discuss contraceptive options today? No   ?  ? Contraception Wrap Up  ? Current Method No Contraceptive Precautions   ? End Method No Contraception Precautions   ? Contraception Counseling Provided No   ? ?  ?  ? ?  ?  ? Examination chaperoned by 11/09/21 NP student ? ?Impression and Plan: ?1. Encounter for gynecological examination with Papanicolaou smear of cervix ?Pap sent ?Physical in 1 year ?PAP IN 3 if normal ?Will check labs  ?- Cytology - PAP( [facility]) ?- CBC ?- Comprehensive metabolic panel ?- TSH ?- Lipid panel ? ?2. Screening cholesterol level ? ?- Lipid panel ? ?3. Screening for thyroid disorder ? ?- TSH  ? ? ? ?  ?  ?

## 2021-09-10 LAB — CBC
Hematocrit: 44.9 % (ref 34.0–46.6)
Hemoglobin: 15.3 g/dL (ref 11.1–15.9)
MCH: 30.1 pg (ref 26.6–33.0)
MCHC: 34.1 g/dL (ref 31.5–35.7)
MCV: 88 fL (ref 79–97)
Platelets: 315 10*3/uL (ref 150–450)
RBC: 5.09 x10E6/uL (ref 3.77–5.28)
RDW: 12.5 % (ref 11.7–15.4)
WBC: 8.4 10*3/uL (ref 3.4–10.8)

## 2021-09-10 LAB — LIPID PANEL
Chol/HDL Ratio: 3.8 ratio (ref 0.0–4.4)
Cholesterol, Total: 169 mg/dL (ref 100–199)
HDL: 44 mg/dL (ref >39)
LDL Chol Calc (NIH): 98 mg/dL (ref 0–99)
Triglycerides: 152 mg/dL — ABNORMAL HIGH (ref 0–149)
VLDL Cholesterol Cal: 27 mg/dL (ref 5–40)

## 2021-09-10 LAB — COMPREHENSIVE METABOLIC PANEL
ALT: 10 IU/L (ref 0–32)
AST: 16 IU/L (ref 0–40)
Albumin/Globulin Ratio: 1.6 (ref 1.2–2.2)
Albumin: 4.8 g/dL (ref 3.8–4.8)
Alkaline Phosphatase: 56 IU/L (ref 44–121)
BUN/Creatinine Ratio: 12 (ref 9–23)
BUN: 7 mg/dL (ref 6–20)
Bilirubin Total: 0.6 mg/dL (ref 0.0–1.2)
CO2: 21 mmol/L (ref 20–29)
Calcium: 9.8 mg/dL (ref 8.7–10.2)
Chloride: 103 mmol/L (ref 96–106)
Creatinine, Ser: 0.6 mg/dL (ref 0.57–1.00)
Globulin, Total: 3 g/dL (ref 1.5–4.5)
Glucose: 85 mg/dL (ref 70–99)
Potassium: 4.2 mmol/L (ref 3.5–5.2)
Sodium: 140 mmol/L (ref 134–144)
Total Protein: 7.8 g/dL (ref 6.0–8.5)
eGFR: 123 mL/min/{1.73_m2} (ref >59)

## 2021-09-10 LAB — CYTOLOGY - PAP
Comment: NEGATIVE
Diagnosis: NEGATIVE
High risk HPV: NEGATIVE

## 2021-09-10 LAB — TSH: TSH: 1.65 u[IU]/mL (ref 0.450–4.500)
